# Patient Record
Sex: Male | Born: 1985 | Race: Black or African American | Hispanic: No | Marital: Single | Smoking: Former smoker
Health system: Southern US, Community
[De-identification: ages and names within clinical notes are randomized; demographics above are authoritative.]

## PROBLEM LIST (undated history)

## (undated) DIAGNOSIS — K819 Cholecystitis, unspecified: Secondary | ICD-10-CM

## (undated) DIAGNOSIS — F1910 Other psychoactive substance abuse, uncomplicated: Secondary | ICD-10-CM

## (undated) HISTORY — DX: Other psychoactive substance abuse, uncomplicated: F19.10

## (undated) HISTORY — DX: Cholecystitis, unspecified: K81.9

---

## 2004-09-05 ENCOUNTER — Emergency Department: Payer: Self-pay | Admitting: General Practice

## 2005-01-05 ENCOUNTER — Emergency Department: Payer: Self-pay | Admitting: Emergency Medicine

## 2005-01-06 ENCOUNTER — Emergency Department: Payer: Self-pay | Admitting: Emergency Medicine

## 2005-10-25 ENCOUNTER — Emergency Department: Payer: Self-pay | Admitting: Emergency Medicine

## 2007-03-29 ENCOUNTER — Emergency Department: Payer: Self-pay | Admitting: Emergency Medicine

## 2009-07-16 ENCOUNTER — Emergency Department: Payer: Self-pay | Admitting: Emergency Medicine

## 2009-11-09 ENCOUNTER — Emergency Department: Payer: Self-pay | Admitting: Emergency Medicine

## 2010-05-30 ENCOUNTER — Emergency Department: Payer: Self-pay | Admitting: Emergency Medicine

## 2010-06-26 ENCOUNTER — Emergency Department: Payer: Self-pay | Admitting: Emergency Medicine

## 2010-06-27 ENCOUNTER — Inpatient Hospital Stay: Payer: Self-pay | Admitting: Psychiatry

## 2010-09-08 ENCOUNTER — Emergency Department: Payer: Self-pay | Admitting: Emergency Medicine

## 2010-10-02 ENCOUNTER — Emergency Department: Payer: Self-pay | Admitting: Emergency Medicine

## 2011-01-25 ENCOUNTER — Emergency Department: Payer: Self-pay | Admitting: Emergency Medicine

## 2012-02-04 ENCOUNTER — Emergency Department: Payer: Self-pay | Admitting: Internal Medicine

## 2012-02-04 LAB — URINALYSIS, COMPLETE
Bacteria: NONE SEEN
Blood: NEGATIVE
Glucose,UR: NEGATIVE mg/dL (ref 0–75)
Ketone: NEGATIVE
Leukocyte Esterase: NEGATIVE
Nitrite: NEGATIVE
RBC,UR: <1 /HPF (ref 0–5)
Squamous Epithelial: NONE SEEN
WBC UR: <1 /HPF (ref 0–5)

## 2012-02-04 LAB — DRUG SCREEN, URINE
Amphetamines, Ur Screen: NEGATIVE (ref ?–1000)
Benzodiazepine, Ur Scrn: NEGATIVE (ref ?–200)
Cannabinoid 50 Ng, Ur ~~LOC~~: POSITIVE (ref ?–50)
Cocaine Metabolite,Ur ~~LOC~~: NEGATIVE (ref ?–300)
MDMA (Ecstasy)Ur Screen: NEGATIVE (ref ?–500)
Opiate, Ur Screen: NEGATIVE (ref ?–300)
Tricyclic, Ur Screen: NEGATIVE (ref ?–1000)

## 2012-02-04 LAB — COMPREHENSIVE METABOLIC PANEL
Anion Gap: 5 — ABNORMAL LOW (ref 7–16)
Calcium, Total: 9.3 mg/dL (ref 8.5–10.1)
Chloride: 107 mmol/L (ref 98–107)
Co2: 29 mmol/L (ref 21–32)
EGFR (Non-African Amer.): >60
Osmolality: 280 (ref 275–301)
Potassium: 3.5 mmol/L (ref 3.5–5.1)
Sodium: 141 mmol/L (ref 136–145)
Total Protein: 8 g/dL (ref 6.4–8.2)

## 2012-02-04 LAB — CBC
HCT: 46.4 % (ref 40.0–52.0)
HGB: 16 g/dL (ref 13.0–18.0)
MCH: 28.8 pg (ref 26.0–34.0)
MCV: 84 fL (ref 80–100)
RBC: 5.55 10*6/uL (ref 4.40–5.90)
WBC: 7 10*3/uL (ref 3.8–10.6)

## 2012-08-08 ENCOUNTER — Emergency Department: Payer: Self-pay | Admitting: Emergency Medicine

## 2012-08-08 LAB — COMPREHENSIVE METABOLIC PANEL
Albumin: 3.8 g/dL (ref 3.4–5.0)
Alkaline Phosphatase: 94 U/L (ref 50–136)
Anion Gap: 3 — ABNORMAL LOW (ref 7–16)
BUN: 11 mg/dL (ref 7–18)
Calcium, Total: 9.2 mg/dL (ref 8.5–10.1)
Chloride: 107 mmol/L (ref 98–107)
Co2: 28 mmol/L (ref 21–32)
Creatinine: 1.02 mg/dL (ref 0.60–1.30)
EGFR (Non-African Amer.): >60
Glucose: 97 mg/dL (ref 65–99)
Sodium: 138 mmol/L (ref 136–145)

## 2012-08-08 LAB — RAPID HIV-1/2 QL/CONFIRM: HIV-1/2,Rapid Ql: NEGATIVE

## 2012-08-08 LAB — CBC
HCT: 42.2 % (ref 40.0–52.0)
MCH: 28.2 pg (ref 26.0–34.0)
MCV: 83 fL (ref 80–100)
RBC: 5.06 10*6/uL (ref 4.40–5.90)

## 2012-09-27 ENCOUNTER — Emergency Department: Payer: Self-pay | Admitting: Emergency Medicine

## 2012-09-27 LAB — DRUG SCREEN, URINE
Amphetamines, Ur Screen: NEGATIVE (ref ?–1000)
Benzodiazepine, Ur Scrn: NEGATIVE (ref ?–200)
Cannabinoid 50 Ng, Ur ~~LOC~~: POSITIVE (ref ?–50)
MDMA (Ecstasy)Ur Screen: NEGATIVE (ref ?–500)
Methadone, Ur Screen: NEGATIVE (ref ?–300)
Opiate, Ur Screen: NEGATIVE (ref ?–300)
Tricyclic, Ur Screen: NEGATIVE (ref ?–1000)

## 2012-09-27 LAB — URINALYSIS, COMPLETE
Bacteria: NONE SEEN
Bilirubin,UR: NEGATIVE
Glucose,UR: NEGATIVE mg/dL (ref 0–75)
Nitrite: NEGATIVE
Ph: 6 (ref 4.5–8.0)
Protein: NEGATIVE
RBC,UR: 1 /HPF (ref 0–5)
Specific Gravity: 1.03 (ref 1.003–1.030)

## 2012-09-27 LAB — COMPREHENSIVE METABOLIC PANEL
Albumin: 3.9 g/dL (ref 3.4–5.0)
Alkaline Phosphatase: 78 U/L (ref 50–136)
Bilirubin,Total: 0.3 mg/dL (ref 0.2–1.0)
Chloride: 108 mmol/L — ABNORMAL HIGH (ref 98–107)
Creatinine: 1.27 mg/dL (ref 0.60–1.30)
EGFR (African American): >60
Glucose: 104 mg/dL — ABNORMAL HIGH (ref 65–99)
Osmolality: 281 (ref 275–301)
Potassium: 4 mmol/L (ref 3.5–5.1)
SGOT(AST): 28 U/L (ref 15–37)
SGPT (ALT): 27 U/L (ref 12–78)

## 2012-09-27 LAB — PROTIME-INR
INR: 1
Prothrombin Time: 13.8 secs (ref 11.5–14.7)

## 2012-09-27 LAB — APTT: Activated PTT: 27.6 secs (ref 23.6–35.9)

## 2012-09-27 LAB — CBC
HGB: 14.4 g/dL (ref 13.0–18.0)
MCV: 82 fL (ref 80–100)
Platelet: 201 10*3/uL (ref 150–440)
RBC: 5.16 10*6/uL (ref 4.40–5.90)
RDW: 12.9 % (ref 11.5–14.5)
WBC: 7.4 10*3/uL (ref 3.8–10.6)

## 2012-09-27 LAB — ETHANOL: Ethanol: <3 mg/dL

## 2012-09-27 LAB — PHENYTOIN LEVEL, TOTAL: Dilantin: <0.4 ug/mL — ABNORMAL LOW (ref 10.0–20.0)

## 2012-09-29 IMAGING — CT CT HEAD WITHOUT CONTRAST
2 series · 16 of 30 positions shown, 20 images · non-contrast
Comparison: none

REASON FOR EXAM: seizure
COMMENTS:

[Series 2: without · axial · non-contrast · 0.41mm/px · z∈[+506,+621]mm · 13 of 27 slices shown, 17 images]
[im 2/27  brain]
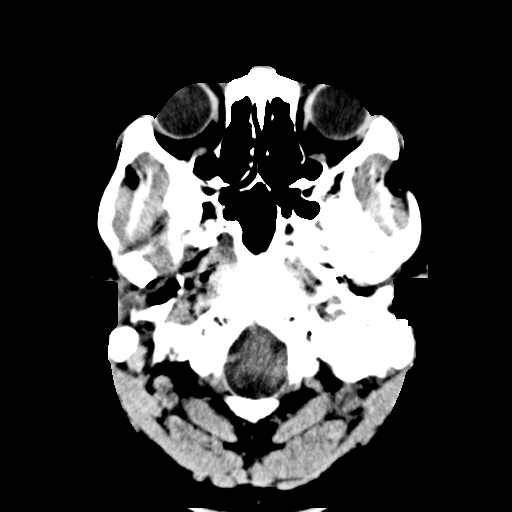
[im 2/27  bone]
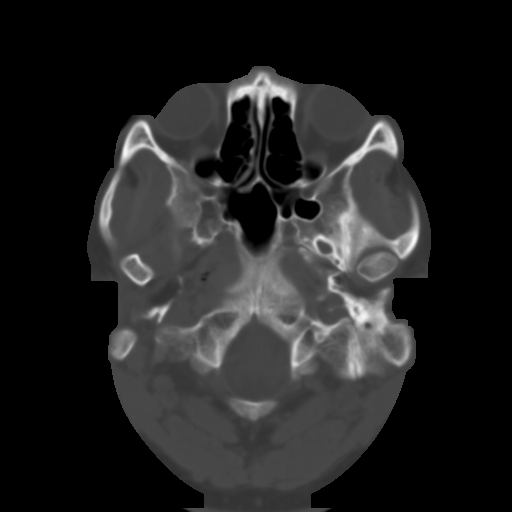
[im 4/27  brain]
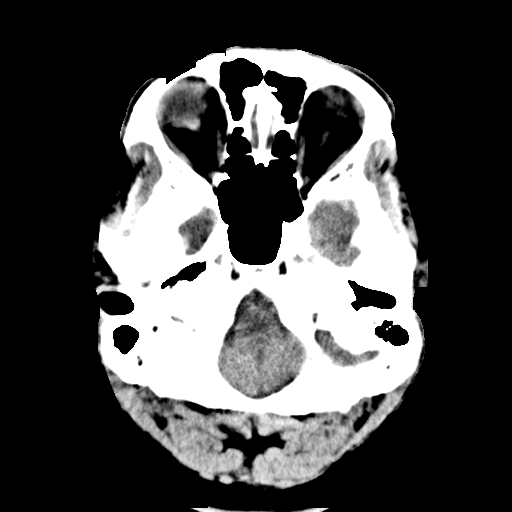
[im 6/27  brain]
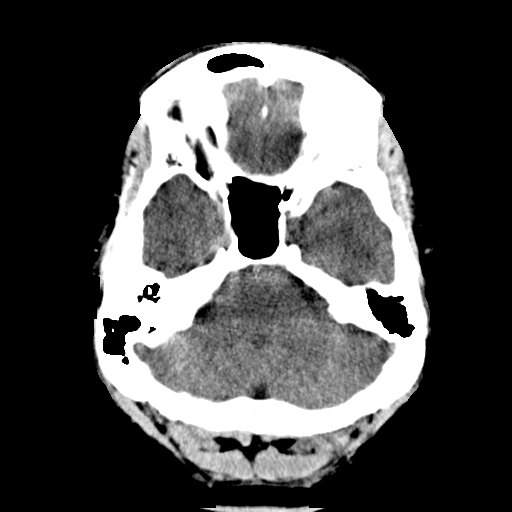
[im 8/27  brain]
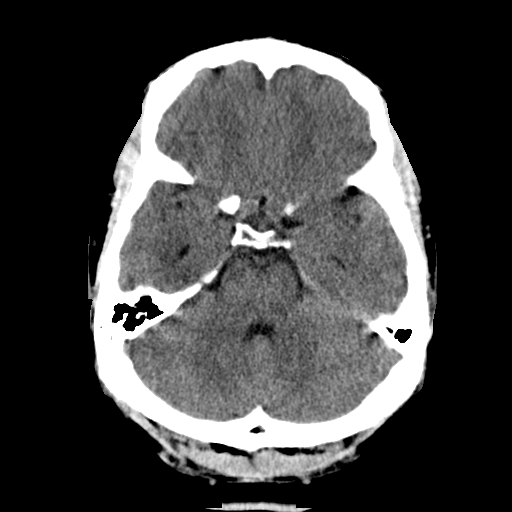
[im 10/27  brain]
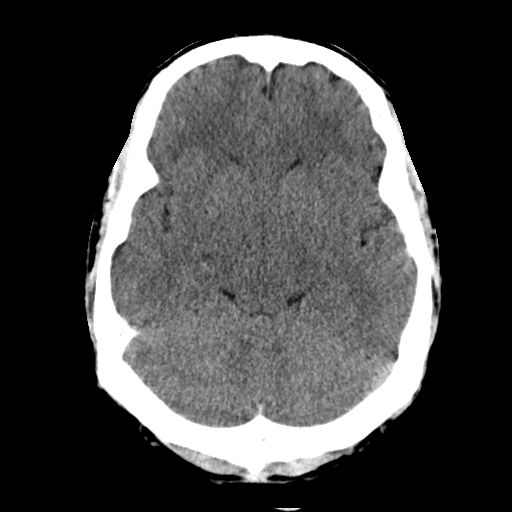
[im 10/27  bone]
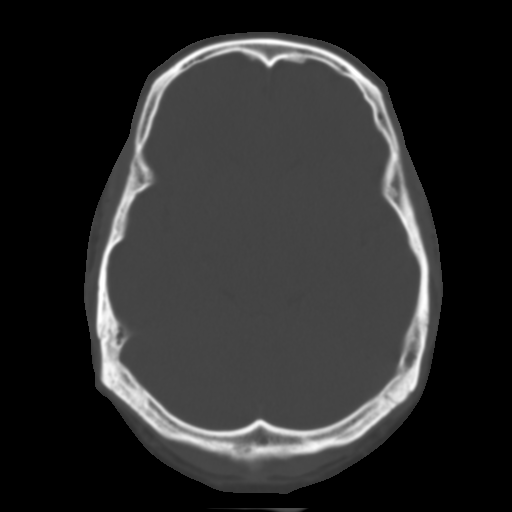
[im 12/27  brain]
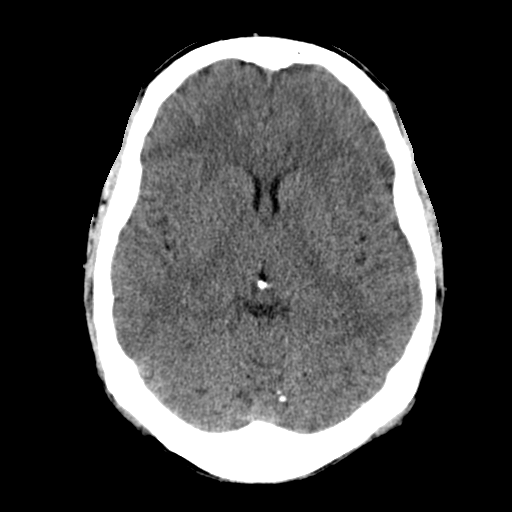
[im 14/27  brain]
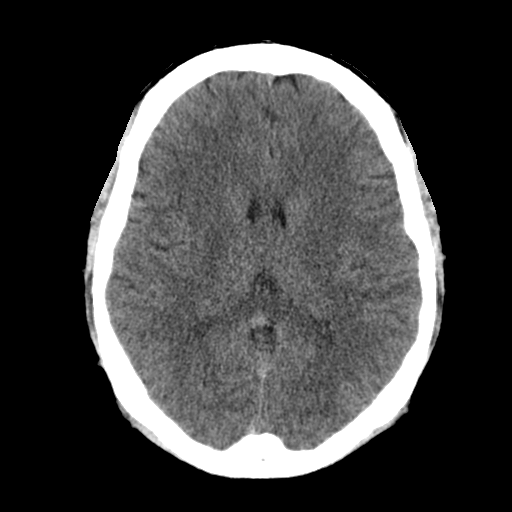
[im 15/27  brain]
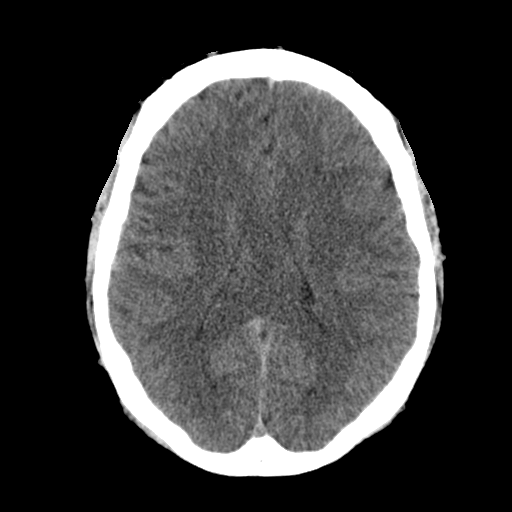
[im 17/27  brain]
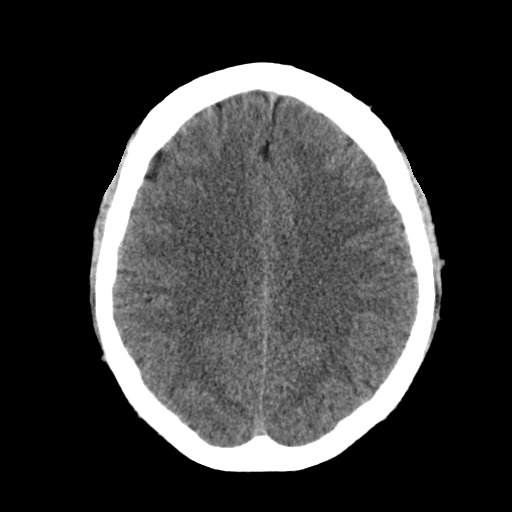
[im 17/27  bone]
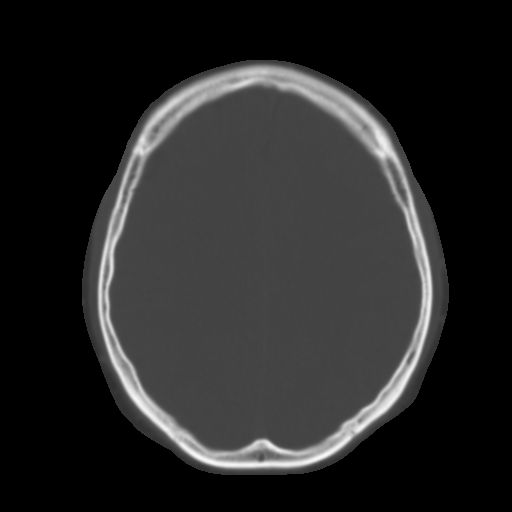
[im 19/27  brain]
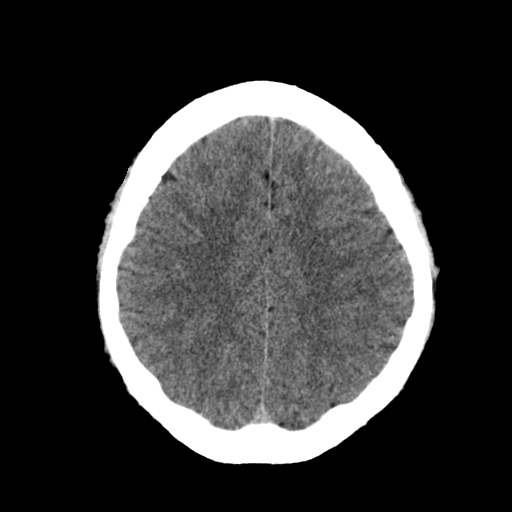
[im 21/27  brain]
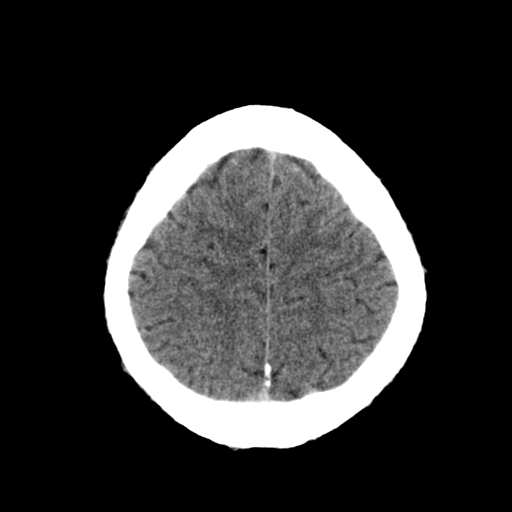
[im 23/27  brain]
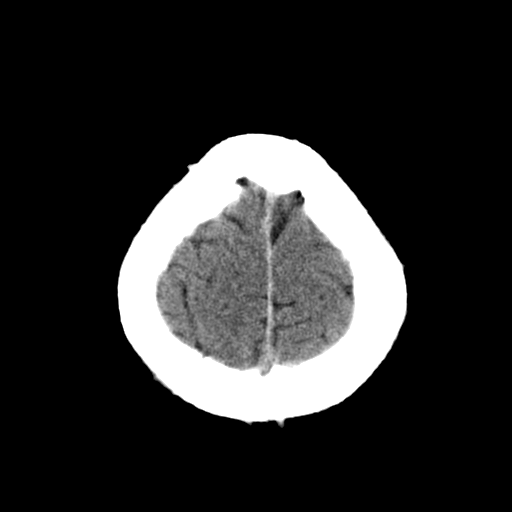
[im 25/27  brain]
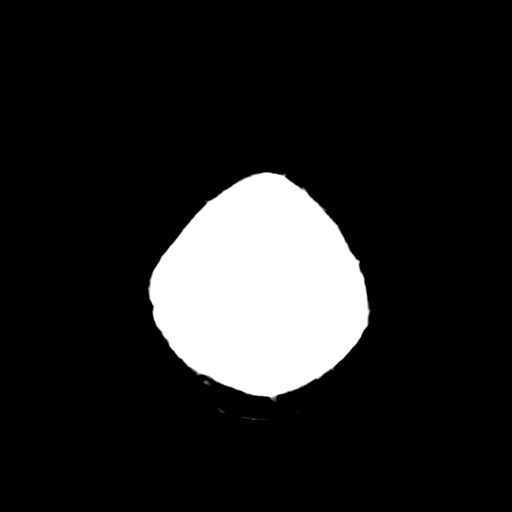
[im 25/27  bone]
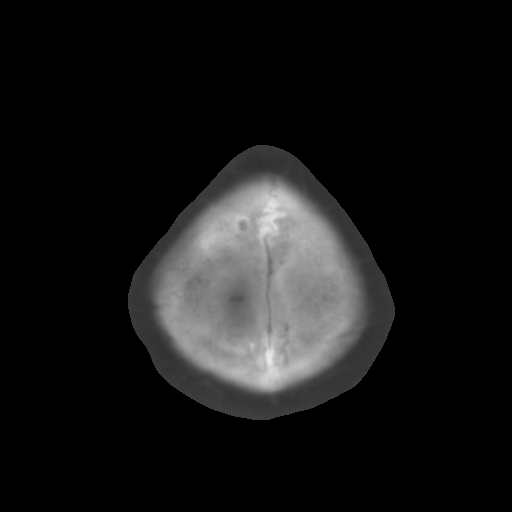

[Series 3: bone · axial · 0.41mm/px · z∈[+506,+546]mm · 3 of 27 slices shown]
[im 2/27  bone]
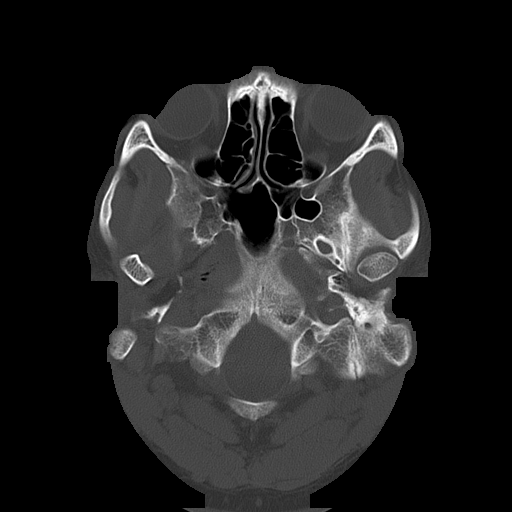
[im 6/27  bone]
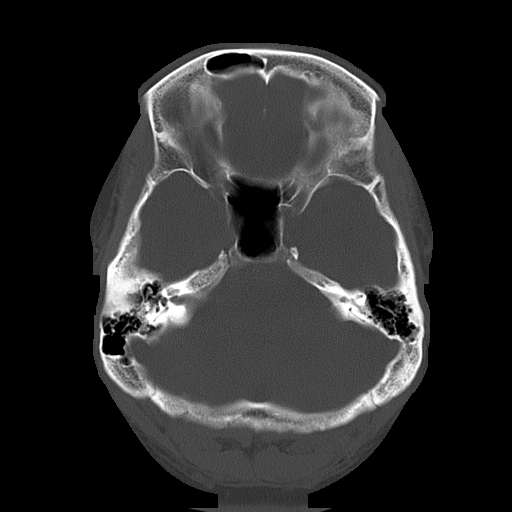
[im 10/27  bone]
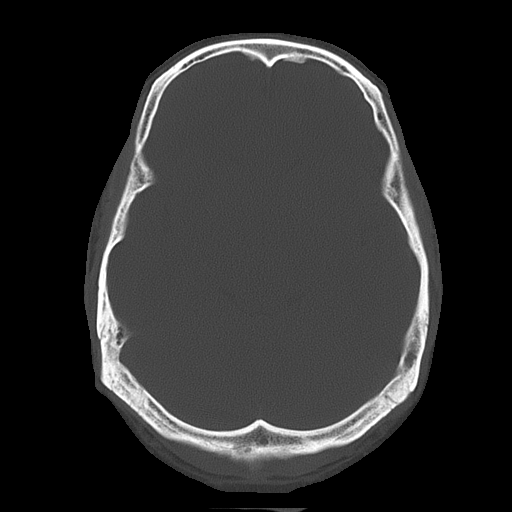

[16 of 30 positions shown; findings below may reference images not displayed]

PROCEDURE:     CT  - CT HEAD WITHOUT CONTRAST  - May 30, 2010  [DATE]

RESULT:     Axial noncontrast CT scanning was performed through the brain at
5 mm intervals and slice thicknesses.

The ventricles are normal in size and position. There is no intracranial
hemorrhage nor intracranial mass effect. There is no evidence of an evolving
ischemic infarction. The cerebellum and brainstem are normal in density. At
bone window settings the observed portions of the paranasal sinuses and
mastoid air cells are clear. There is no evidence of an acute skull fracture.
IMPRESSION: 1. I see no acute intracranial abnormality.
2. There is no evidence of an acute skull fracture.

## 2013-10-14 DIAGNOSIS — F329 Major depressive disorder, single episode, unspecified: Secondary | ICD-10-CM | POA: Insufficient documentation

## 2013-10-14 DIAGNOSIS — F32A Depression, unspecified: Secondary | ICD-10-CM | POA: Insufficient documentation

## 2015-02-10 ENCOUNTER — Emergency Department
Admission: EM | Admit: 2015-02-10 | Discharge: 2015-02-10 | Disposition: A | Discharge status: Home / Self Care | Payer: 59 | Attending: Emergency Medicine | Admitting: Emergency Medicine

## 2015-02-10 ENCOUNTER — Encounter: Payer: Self-pay | Admitting: *Deleted

## 2015-02-10 DIAGNOSIS — F17200 Nicotine dependence, unspecified, uncomplicated: Secondary | ICD-10-CM | POA: Diagnosis not present

## 2015-02-10 DIAGNOSIS — J020 Streptococcal pharyngitis: Secondary | ICD-10-CM | POA: Insufficient documentation

## 2015-02-10 DIAGNOSIS — Z880 Allergy status to penicillin: Secondary | ICD-10-CM | POA: Insufficient documentation

## 2015-02-10 DIAGNOSIS — J029 Acute pharyngitis, unspecified: Secondary | ICD-10-CM | POA: Diagnosis present

## 2015-02-10 DIAGNOSIS — M549 Dorsalgia, unspecified: Secondary | ICD-10-CM | POA: Diagnosis not present

## 2015-02-10 DIAGNOSIS — H9203 Otalgia, bilateral: Secondary | ICD-10-CM | POA: Insufficient documentation

## 2015-02-10 DIAGNOSIS — M542 Cervicalgia: Secondary | ICD-10-CM | POA: Diagnosis not present

## 2015-02-10 MED ORDER — IBUPROFEN 800 MG PO TABS: 800.0000 mg | ORAL_TABLET | Freq: Three times a day (TID) | ORAL | Status: DC | PRN

## 2015-02-10 MED ORDER — CYCLOBENZAPRINE HCL 10 MG PO TABS: 10.0000 mg | ORAL_TABLET | Freq: Three times a day (TID) | ORAL | Status: DC | PRN

## 2015-02-10 MED ORDER — AZITHROMYCIN 250 MG PO TABS: ORAL_TABLET | ORAL | Status: DC

## 2015-02-10 MED ORDER — CLINDAMYCIN HCL 300 MG PO CAPS: 300.0000 mg | ORAL_CAPSULE | Freq: Three times a day (TID) | ORAL | Status: DC

## 2015-02-10 NOTE — ED Notes (Signed)
Has sharp pain behind both ears. Also side of left neck pain, hurt to move, back of throat tonsils slightly enlarged

## 2015-02-10 NOTE — ED Provider Notes (Signed)
[facility] Emergency Department Provider Note  ____________________________________________  Time seen: Approximately 12:58 PM  I have reviewed the triage vital signs and the nursing notes.   HISTORY  Chief Complaint Sore Throat    HPI Jeffery Sutton is a 29 y.o. male who presents for evaluation bilateral ear pain with pain behind both ears also on the left side of his neck. Patient complains of his tonsils are slightly enlarged.   History reviewed. No pertinent past medical history.  There are no active problems to display for this patient.   History reviewed. No pertinent past surgical history.  Current Outpatient Rx  Name  Route  Sig  Dispense  Refill  . clindamycin (CLEOCIN) 300 MG capsule   Oral   Take 1 capsule (300 mg total) by mouth 3 (three) times daily.   30 capsule   0   . cyclobenzaprine (FLEXERIL) 10 MG tablet   Oral   Take 1 tablet (10 mg total) by mouth every 8 (eight) hours as needed for muscle spasms.   30 tablet   1   . ibuprofen (ADVIL,MOTRIN) 800 MG tablet   Oral   Take 1 tablet (800 mg total) by mouth every 8 (eight) hours as needed.   30 tablet   0     Allergies Zithromax and Penicillins  No family history on file.  Social History Social History  Substance Use Topics  . Smoking status: Current Every Day Smoker  . Smokeless tobacco: None  . Alcohol Use: Yes    Review of Systems Constitutional: No fever/chills Eyes: No visual changes. ENT: Positive sore throat and positive bilateral ear pain. Cardiovascular: Denies chest pain. Respiratory: Denies shortness of breath. Gastrointestinal: No abdominal pain.  No nausea, no vomiting.  No diarrhea.  No constipation. Genitourinary: Negative for dysuria. Musculoskeletal: Positive for neck and back pain. Skin: Negative for rash. Neurological: Negative for headaches, focal weakness or numbness.  10-point ROS otherwise  negative.  ____________________________________________   PHYSICAL EXAM:  VITAL SIGNS: ED Triage Vitals  Enc Vitals Group     BP 02/10/15 1246 134/89 mmHg     Pulse Rate 02/10/15 1246 78     Resp 02/10/15 1246 20     Temp 02/10/15 1246 98.4 F (36.9 C)     Temp Source 02/10/15 1246 Oral     SpO2 02/10/15 1246 98 %     Weight 02/10/15 1246 190 lb (86.183 kg)     Height 02/10/15 1246 6' (1.829 m)     Head Cir --      Peak Flow --      Pain Score 02/10/15 1248 4     Pain Loc --      Pain Edu? --      Excl. in GC? --     Constitutional: Alert and oriented. Well appearing and in no acute distress. Eyes: Conjunctivae are normal. PERRL. EOMI. Head: Atraumatic. Nose: No congestion/rhinnorhea. Mouth/Throat: Mucous membranes are moist.  Oropharynx non-erythematous. Neck: No stridor.   Cardiovascular: Normal rate, regular rhythm. Grossly normal heart sounds.  Good peripheral circulation. Respiratory: Normal respiratory effort.  No retractions. Lungs CTAB. Gastrointestinal: Soft and nontender. No distention. No abdominal bruits. No CVA tenderness. Musculoskeletal: No lower extremity tenderness nor edema.  No joint effusions. Neurologic:  Normal speech and language. No gross focal neurologic deficits are appreciated. No gait instability. Skin:  Skin is warm, dry and intact. No rash noted. Psychiatric: Mood and affect are normal. Speech and behavior are normal.  ____________________________________________   LABS (all labs ordered are listed, but only abnormal results are displayed)  Labs Reviewed - No data to display ____________________________________________    PROCEDURES  Procedure(s) performed: None  Critical Care performed: No  ____________________________________________   INITIAL IMPRESSION / ASSESSMENT AND PLAN / ED COURSE  Pertinent labs & imaging results that were available during my care of the patient were reviewed by me and considered in my medical  decision making (see chart for details).  Acute strep pharyngitis. Rx given for clindamycin 300 mg 3 times a day 10 days secondary to Zithromax and penicillin allergy. In addition Rx was given for Motrin 800 and Flexeril 10 mg. Patient to follow up with PCP for referral to ENT. Work excuse given times one day. Patient to return with any worsening symptomology. ____________________________________________   FINAL CLINICAL IMPRESSION(S) / ED DIAGNOSES  Final diagnoses:  Streptococcal pharyngitis      Evangeline DakinCharles M Beers, PA-C 02/10/15 1342  Richardean Canalavid H Yao, MD 02/10/15 [phone]

## 2015-02-15 LAB — POCT RAPID STREP A: Streptococcus, Group A Screen (Direct): POSITIVE — AB

## 2015-10-30 ENCOUNTER — Observation Stay: Payer: Commercial Managed Care - HMO | Admitting: Anesthesiology

## 2015-10-30 ENCOUNTER — Emergency Department: Payer: Commercial Managed Care - HMO

## 2015-10-30 ENCOUNTER — Encounter
Admission: EM | Disposition: A | Discharge status: Home / Self Care | Payer: Self-pay | Source: Home / Self Care | Attending: Emergency Medicine

## 2015-10-30 ENCOUNTER — Encounter: Payer: Self-pay | Admitting: Emergency Medicine

## 2015-10-30 ENCOUNTER — Ambulatory Visit: Admit: 2015-10-30 | Payer: Self-pay | Admitting: Surgery

## 2015-10-30 ENCOUNTER — Observation Stay
Admission: EM | Admit: 2015-10-30 | Discharge: 2015-11-01 | Disposition: A | Discharge status: Home / Self Care | Payer: Commercial Managed Care - HMO | Attending: Surgery | Admitting: Surgery

## 2015-10-30 DIAGNOSIS — L0291 Cutaneous abscess, unspecified: Secondary | ICD-10-CM

## 2015-10-30 DIAGNOSIS — Z880 Allergy status to penicillin: Secondary | ICD-10-CM | POA: Insufficient documentation

## 2015-10-30 DIAGNOSIS — L02414 Cutaneous abscess of left upper limb: Secondary | ICD-10-CM | POA: Diagnosis not present

## 2015-10-30 DIAGNOSIS — F17210 Nicotine dependence, cigarettes, uncomplicated: Secondary | ICD-10-CM | POA: Insufficient documentation

## 2015-10-30 HISTORY — PX: I & D EXTREMITY: SHX5045

## 2015-10-30 LAB — BASIC METABOLIC PANEL
Anion gap: 9 (ref 5–15)
BUN: 9 mg/dL (ref 6–20)
CALCIUM: 9.3 mg/dL (ref 8.9–10.3)
CO2: 22 mmol/L (ref 22–32)
CREATININE: 1.1 mg/dL (ref 0.61–1.24)
Chloride: 107 mmol/L (ref 101–111)
GFR calc non Af Amer: >60 mL/min (ref >60)
Glucose, Bld: 94 mg/dL (ref 65–99)
Potassium: 3.7 mmol/L (ref 3.5–5.1)
SODIUM: 138 mmol/L (ref 135–145)

## 2015-10-30 LAB — CBC WITH DIFFERENTIAL/PLATELET
BASOS PCT: 0 %
Basophils Absolute: 0 10*3/uL (ref 0–0.1)
EOS ABS: 0.1 10*3/uL (ref 0–0.7)
EOS PCT: 0 %
HCT: 45.9 % (ref 40.0–52.0)
Hemoglobin: 15.7 g/dL (ref 13.0–18.0)
Lymphocytes Relative: 7 %
Lymphs Abs: 1.2 10*3/uL (ref 1.0–3.6)
MCH: 28.3 pg (ref 26.0–34.0)
MCHC: 34.3 g/dL (ref 32.0–36.0)
MCV: 82.5 fL (ref 80.0–100.0)
MONO ABS: 1.3 10*3/uL — AB (ref 0.2–1.0)
MONOS PCT: 8 %
Neutro Abs: 13.5 10*3/uL — ABNORMAL HIGH (ref 1.4–6.5)
Neutrophils Relative %: 85 %
Platelets: 244 10*3/uL (ref 150–440)
RBC: 5.56 MIL/uL (ref 4.40–5.90)
RDW: 14.1 % (ref 11.5–14.5)
WBC: 16.1 10*3/uL — ABNORMAL HIGH (ref 3.8–10.6)

## 2015-10-30 SURGERY — IRRIGATION AND DEBRIDEMENT EXTREMITY
Anesthesia: General | Site: Arm Upper | Laterality: Left | Wound class: Dirty or Infected

## 2015-10-30 MED ORDER — BUPIVACAINE HCL (PF) 0.5 % IJ SOLN
INTRAMUSCULAR | Status: AC
  Filled 2015-10-30: qty 30

## 2015-10-30 MED ORDER — ACETAMINOPHEN 650 MG RE SUPP: 650.0000 mg | Freq: Four times a day (QID) | RECTAL | Status: DC | PRN

## 2015-10-30 MED ORDER — MORPHINE SULFATE (PF) 4 MG/ML IV SOLN
4.0000 mg | Freq: Once | INTRAVENOUS | Status: AC
  Administered 2015-10-30: 4 mg via INTRAVENOUS
  Filled 2015-10-30: qty 1

## 2015-10-30 MED ORDER — ACETAMINOPHEN 10 MG/ML IV SOLN
INTRAVENOUS | Status: AC
  Filled 2015-10-30: qty 100

## 2015-10-30 MED ORDER — ONDANSETRON HCL 4 MG/2ML IJ SOLN: 4.0000 mg | Freq: Once | INTRAMUSCULAR | Status: DC | PRN

## 2015-10-30 MED ORDER — MORPHINE SULFATE (PF) 4 MG/ML IV SOLN
4.0000 mg | INTRAVENOUS | Status: DC | PRN
  Administered 2015-10-31: 4 mg via INTRAVENOUS
  Filled 2015-10-30: qty 1

## 2015-10-30 MED ORDER — VANCOMYCIN HCL IN DEXTROSE 1-5 GM/200ML-% IV SOLN
1000.0000 mg | Freq: Once | INTRAVENOUS | Status: AC
  Administered 2015-10-30: 1000 mg via INTRAVENOUS
  Filled 2015-10-30: qty 200

## 2015-10-30 MED ORDER — ONDANSETRON 4 MG PO TBDP: 4.0000 mg | ORAL_TABLET | Freq: Four times a day (QID) | ORAL | Status: DC | PRN

## 2015-10-30 MED ORDER — KCL IN DEXTROSE-NACL 20-5-0.45 MEQ/L-%-% IV SOLN
INTRAVENOUS | Status: DC
  Administered 2015-10-30: 20:00:00 via INTRAVENOUS
  Filled 2015-10-30 (×4): qty 1000

## 2015-10-30 MED ORDER — FENTANYL CITRATE (PF) 100 MCG/2ML IJ SOLN
INTRAMUSCULAR | Status: DC | PRN
  Administered 2015-10-30 (×2): 50 ug via INTRAVENOUS

## 2015-10-30 MED ORDER — CEFAZOLIN SODIUM-DEXTROSE 2-4 GM/100ML-% IV SOLN
2.0000 g | Freq: Three times a day (TID) | INTRAVENOUS | Status: DC
  Administered 2015-10-30 – 2015-10-31 (×3): 2 g via INTRAVENOUS
  Filled 2015-10-30 (×6): qty 100

## 2015-10-30 MED ORDER — HYDROCODONE-ACETAMINOPHEN 5-325 MG PO TABS: 1.0000 | ORAL_TABLET | ORAL | Status: DC | PRN

## 2015-10-30 MED ORDER — PROPOFOL 10 MG/ML IV BOLUS
INTRAVENOUS | Status: DC | PRN
  Administered 2015-10-30: 140 mg via INTRAVENOUS

## 2015-10-30 MED ORDER — ONDANSETRON HCL 4 MG/2ML IJ SOLN: 4.0000 mg | Freq: Four times a day (QID) | INTRAMUSCULAR | Status: DC | PRN

## 2015-10-30 MED ORDER — ACETAMINOPHEN 10 MG/ML IV SOLN
INTRAVENOUS | Status: DC | PRN
  Administered 2015-10-30: 1000 mg via INTRAVENOUS

## 2015-10-30 MED ORDER — LIDOCAINE HCL (PF) 1 % IJ SOLN
INTRAMUSCULAR | Status: AC
  Filled 2015-10-30: qty 30

## 2015-10-30 MED ORDER — MIDAZOLAM HCL 2 MG/2ML IJ SOLN
INTRAMUSCULAR | Status: DC | PRN
  Administered 2015-10-30: 2 mg via INTRAVENOUS

## 2015-10-30 MED ORDER — HYDROCODONE-ACETAMINOPHEN 5-325 MG PO TABS
1.0000 | ORAL_TABLET | ORAL | Status: DC | PRN
  Administered 2015-10-31: 1 via ORAL
  Administered 2015-10-31 (×3): 2 via ORAL
  Administered 2015-10-31: 1 via ORAL
  Administered 2015-11-01: 2 via ORAL
  Filled 2015-10-30 (×2): qty 2
  Filled 2015-10-30: qty 1
  Filled 2015-10-30 (×2): qty 2
  Filled 2015-10-30: qty 1

## 2015-10-30 MED ORDER — SODIUM CHLORIDE 0.9 % IV BOLUS (SEPSIS)
1000.0000 mL | Freq: Once | INTRAVENOUS | Status: AC
  Administered 2015-10-30: 1000 mL via INTRAVENOUS

## 2015-10-30 MED ORDER — LACTATED RINGERS IV SOLN
INTRAVENOUS | Status: DC | PRN
  Administered 2015-10-30: 18:00:00 via INTRAVENOUS

## 2015-10-30 MED ORDER — DEXAMETHASONE SODIUM PHOSPHATE 10 MG/ML IJ SOLN
INTRAMUSCULAR | Status: DC | PRN
  Administered 2015-10-30: 10 mg via INTRAVENOUS

## 2015-10-30 MED ORDER — MORPHINE SULFATE (PF) 4 MG/ML IV SOLN: 4.0000 mg | INTRAVENOUS | Status: DC | PRN

## 2015-10-30 MED ORDER — ONDANSETRON HCL 4 MG/2ML IJ SOLN
INTRAMUSCULAR | Status: DC | PRN
  Administered 2015-10-30: 4 mg via INTRAVENOUS

## 2015-10-30 MED ORDER — IOPAMIDOL (ISOVUE-300) INJECTION 61%
100.0000 mL | Freq: Once | INTRAVENOUS | Status: AC | PRN
  Administered 2015-10-30: 100 mL via INTRAVENOUS

## 2015-10-30 MED ORDER — ACETAMINOPHEN 325 MG PO TABS
650.0000 mg | ORAL_TABLET | Freq: Four times a day (QID) | ORAL | Status: DC | PRN
  Administered 2015-10-31: 650 mg via ORAL
  Filled 2015-10-30: qty 2

## 2015-10-30 MED ORDER — ACETAMINOPHEN 325 MG PO TABS: 650.0000 mg | ORAL_TABLET | Freq: Four times a day (QID) | ORAL | Status: DC | PRN

## 2015-10-30 MED ORDER — ONDANSETRON HCL 4 MG/2ML IJ SOLN
4.0000 mg | Freq: Once | INTRAMUSCULAR | Status: AC
  Administered 2015-10-30: 4 mg via INTRAVENOUS
  Filled 2015-10-30: qty 2

## 2015-10-30 MED ORDER — KCL IN DEXTROSE-NACL 20-5-0.45 MEQ/L-%-% IV SOLN
INTRAVENOUS | Status: DC
  Filled 2015-10-30 (×2): qty 1000

## 2015-10-30 MED ORDER — FENTANYL CITRATE (PF) 100 MCG/2ML IJ SOLN
25.0000 ug | INTRAMUSCULAR | Status: DC | PRN
  Administered 2015-10-30 (×2): 25 ug via INTRAVENOUS

## 2015-10-30 MED ORDER — GLYCOPYRROLATE 0.2 MG/ML IJ SOLN
INTRAMUSCULAR | Status: DC | PRN
  Administered 2015-10-30: 0.2 mg via INTRAVENOUS

## 2015-10-30 MED ORDER — FENTANYL CITRATE (PF) 100 MCG/2ML IJ SOLN
INTRAMUSCULAR | Status: AC
  Administered 2015-10-30: 25 ug via INTRAVENOUS
  Filled 2015-10-30: qty 2

## 2015-10-30 MED ORDER — CEFAZOLIN SODIUM-DEXTROSE 2-4 GM/100ML-% IV SOLN
2.0000 g | INTRAVENOUS | Status: DC
  Filled 2015-10-30: qty 100

## 2015-10-30 SURGICAL SUPPLY — 25 items
BLADE SURG 15 STRL LF DISP TIS (BLADE) ×2 IMPLANT
BLADE SURG 15 STRL SS (BLADE) ×1
CHLORAPREP W/TINT 26ML (MISCELLANEOUS) ×3 IMPLANT
DRAPE LAPAROTOMY 100X77 ABD (DRAPES) ×3 IMPLANT
ELECT CAUTERY NEEDLE TIP 1.0 (MISCELLANEOUS) ×3
ELECT REM PT RETURN 9FT ADLT (ELECTROSURGICAL) ×3
ELECTRODE CAUTERY NEDL TIP 1.0 (MISCELLANEOUS) ×2 IMPLANT
ELECTRODE REM PT RTRN 9FT ADLT (ELECTROSURGICAL) ×2 IMPLANT
GAUZE SPONGE 4X4 12PLY STRL (GAUZE/BANDAGES/DRESSINGS) ×3 IMPLANT
GLOVE BIO SURGEON STRL SZ7.5 (GLOVE) ×6 IMPLANT
GLOVE INDICATOR 8.0 STRL GRN (GLOVE) ×6 IMPLANT
GOWN STRL REUS W/ TWL LRG LVL3 (GOWN DISPOSABLE) ×2 IMPLANT
GOWN STRL REUS W/TWL LRG LVL3 (GOWN DISPOSABLE) ×1
KIT RM TURNOVER STRD PROC AR (KITS) ×3 IMPLANT
LABEL OR SOLS (LABEL) ×3 IMPLANT
NEEDLE HYPO 25X1 1.5 SAFETY (NEEDLE) ×3 IMPLANT
NS IRRIG 500ML POUR BTL (IV SOLUTION) ×3 IMPLANT
PACK BASIN MINOR ARMC (MISCELLANEOUS) ×3 IMPLANT
SUT ETHILON 4-0 (SUTURE) ×1
SUT ETHILON 4-0 FS2 18XMFL BLK (SUTURE) ×2
SUT SILK 3 0 REEL (SUTURE) ×3 IMPLANT
SUTURE ETHLN 4-0 FS2 18XMF BLK (SUTURE) ×2 IMPLANT
SWAB DUAL CULTURE TRANS RED ST (MISCELLANEOUS) IMPLANT
SYR BULB EAR ULCER 3OZ GRN STR (SYRINGE) ×3 IMPLANT
SYRINGE 10CC LL (SYRINGE) ×3 IMPLANT

## 2015-10-30 NOTE — ED Notes (Signed)
Patient transported to CT 

## 2015-10-30 NOTE — Anesthesia Preprocedure Evaluation (Signed)
Anesthesia Evaluation  Patient identified by MRN, date of birth, ID band Patient awake    Reviewed: Allergy & Precautions, NPO status , Patient's Chart, lab work & pertinent test results, reviewed documented beta blocker date and time   Airway Mallampati: III  TM Distance: >3 FB     Dental  (+) Chipped   Pulmonary Current Smoker,           Cardiovascular      Neuro/Psych    GI/Hepatic   Endo/Other    Renal/GU      Musculoskeletal   Abdominal   Peds  Hematology   Anesthesia Other Findings   Reproductive/Obstetrics                             Anesthesia Physical Anesthesia Plan  ASA: II  Anesthesia Plan: General   Post-op Pain Management:    Induction: Intravenous  Airway Management Planned: LMA  Additional Equipment:   Intra-op Plan:   Post-operative Plan:   Informed Consent: I have reviewed the patients History and Physical, chart, labs and discussed the procedure including the risks, benefits and alternatives for the proposed anesthesia with the patient or authorized representative who has indicated his/her understanding and acceptance.     Plan Discussed with: CRNA  Anesthesia Plan Comments:         Anesthesia Quick Evaluation

## 2015-10-30 NOTE — Transfer of Care (Signed)
Immediate Anesthesia Transfer of Care Note  Patient: Jeffery SilversJermaine O Sutton  Procedure(s) Performed: Procedure(s): MINOR IRRIGATION AND DEBRIDEMENT EXTREMITY (Left)  Patient Location: PACU  Anesthesia Type:General  Level of Consciousness: sedated and responds to stimulation  Airway & Oxygen Therapy: Patient Spontanous Breathing and Patient connected to nasal cannula oxygen  Post-op Assessment: Report given to RN and Post -op Vital signs reviewed and stable  Post vital signs: Reviewed and stable  Last Vitals:  Vitals:   10/30/15 1730 10/30/15 1847  BP: (!) 143/89 (!) 105/51  Pulse: 84   Resp: 18 15  Temp:  36.3 C    Last Pain:  Vitals:   10/30/15 1847  TempSrc: Tympanic  PainSc:          Complications: No apparent anesthesia complications

## 2015-10-30 NOTE — H&P (Signed)
Jeffery Sutton is a 30 y.o. male  with five-day history of left arm swelling and tenderness.  HPI: He noted some discomfort above his antecubital fossa for 5 days ago which is increased in severity over the last 24 hours. He's had some spontaneous drainage. He's never had a previous similar problem. He denies any IV drug use. He does not have any history of significant infection in the past. White blood cell count was significantly elevated and CT scan demonstrated subcutaneous abscess. The surgical service was consulted.  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 Otherwise he has no major medical problems. He denies any allergies although the chart suggests he has penicillin allergy. He also denies any illicit drug use other than marijuana. We specifically discussed cocaine which he denied.  History reviewed. No pertinent past medical history. History reviewed. No pertinent surgical history. Social History   Social History  . Marital status: Single    Spouse name: N/A  . Number of children: N/A  . Years of education: N/A   Social History Main Topics  . Smoking status: Current Every Day Smoker    Packs/day: 0.50    Types: Cigarettes  . Smokeless tobacco: Never Used  . Alcohol use Yes     Comment: weekends  . Drug use: Unknown  . Sexual activity: Not Asked   Other Topics Concern  . None   Social History Narrative  . None     Review of Systems  All other systems reviewed and are negative.    PHYSICAL EXAM: BP 139/88 (BP Location: Right Arm)   Pulse 80   Temp 98.9 F (37.2 C) (Oral)   Resp 18   Ht 6' (1.829 m)   Wt 86.2 kg (190 lb)   SpO2 100%   BMI 25.77 kg/m   Physical Exam  Constitutional: He is oriented to person, place, and time. He appears well-developed and well-nourished. He appears distressed.  HENT:  Head: Normocephalic and atraumatic.  Eyes: EOM are normal. Pupils are equal, round, and reactive to light.  Neck: Normal range of  motion. Neck supple.  Cardiovascular: Normal rate, regular rhythm and normal heart sounds.   Pulmonary/Chest: Effort normal. No respiratory distress. He has no wheezes.  Abdominal: Soft. Bowel sounds are normal.  Musculoskeletal: Normal range of motion. He exhibits edema. He exhibits no deformity.  Marked edema left upper arm with large abscess just above the antecubital fossa.  Neurological: He is alert and oriented to person, place, and time.  Skin:  Abscess antecubital fossa.  Psychiatric: He has a normal mood and affect. Judgment normal.   See above  Impression/Plan: I have independently reviewed his laboratory values and CT scan. He has significant problems with any sort of manipulation of his arm. He clearly needs some increased drainage as he does not have a very good spontaneous drainage at the present time. We'll taken the operating room open this up under monitored anesthetic care. Risks and benefits have been outlined to the patient in detail and he is in agreement.   Tiney Rougealph Ely III, MD  10/30/2015, 4:58 PM

## 2015-10-30 NOTE — ED Notes (Signed)
Pt dressed in hospital gown. Lip ring removed and given to pt's friend at bedside. Pt states he ate last night, had a soda around 1030 this morning, and a sip of soda around 1630.

## 2015-10-30 NOTE — Anesthesia Procedure Notes (Signed)
Procedure Name: LMA Insertion Date/Time: 10/30/2015 6:16 PM Performed by: Omer JackWEATHERLY, Abas Leicht Pre-anesthesia Checklist: Patient identified, Patient being monitored, Timeout performed, Emergency Drugs available and Suction available Patient Re-evaluated:Patient Re-evaluated prior to inductionOxygen Delivery Method: Circle system utilized Preoxygenation: Pre-oxygenation with 100% oxygen Intubation Type: IV induction Ventilation: Mask ventilation without difficulty LMA: LMA inserted LMA Size: 4.5 Tube type: Oral Number of attempts: 1 Placement Confirmation: positive ETCO2 and breath sounds checked- equal and bilateral Tube secured with: Tape Dental Injury: Teeth and Oropharynx as per pre-operative assessment

## 2015-10-30 NOTE — ED Triage Notes (Signed)
States walked into a spider web on Sunday and notice a bug bite on his left antecube on weds. Red, painful, swollen.

## 2015-10-30 NOTE — ED Provider Notes (Signed)
[facility] Emergency Department Provider Note  Time seen: 1:54 PM  I have reviewed the triage vital signs and the nursing notes.   HISTORY  Chief Complaint Abscess    HPI Charlies SilversJermaine O Golliday is a 30 y.o. male with no past medical history who presents the emergency department with a left arm infection. According to the patient approximately 5 days ago he noted a small bump to his left antecubital fossa. He states over the past 2 days it is becoming extremely painful extremely large and now draining. Patient states subjective fever and chills at home. Denies nausea or vomiting. Describes his pain as moderate dull aching pain. Denies IV drug use.  History reviewed. No pertinent past medical history.  There are no active problems to display for this patient.   History reviewed. No pertinent surgical history.  Prior to Admission medications   Medication Sig Start Date End Date Taking? Authorizing Provider  clindamycin (CLEOCIN) 300 MG capsule Take 1 capsule (300 mg total) by mouth 3 (three) times daily. 02/10/15   Charmayne Sheerharles M Beers, PA-C  cyclobenzaprine (FLEXERIL) 10 MG tablet Take 1 tablet (10 mg total) by mouth every 8 (eight) hours as needed for muscle spasms. 02/10/15   Charmayne Sheerharles M Beers, PA-C  ibuprofen (ADVIL,MOTRIN) 800 MG tablet Take 1 tablet (800 mg total) by mouth every 8 (eight) hours as needed. 02/10/15   Evangeline Dakinharles M Beers, PA-C    Allergies  Allergen Reactions  . Zithromax [Azithromycin] Swelling  . Penicillins Hives    History reviewed. No pertinent family history.  Social History Social History  Substance Use Topics  . Smoking status: Current Every Day Smoker    Packs/day: 0.50    Types: Cigarettes  . Smokeless tobacco: Never Used  . Alcohol use Yes     Comment: weekends    Review of Systems Constitutional: Subjective fever and chills. Cardiovascular: Negative for chest pain. Respiratory: Negative for shortness of breath. Gastrointestinal:  Negative for abdominal pain, vomiting  Musculoskeletal: Negative for back pain. Skin: Large rash/swelling to left antecubital fossa. Neurological: Negative for headache 10-point ROS otherwise negative.  ____________________________________________   PHYSICAL EXAM:  VITAL SIGNS: ED Triage Vitals  Enc Vitals Group     BP 10/30/15 1135 (!) 147/76     Pulse Rate 10/30/15 1135 96     Resp --      Temp 10/30/15 1135 98.9 F (37.2 C)     Temp Source 10/30/15 1135 Oral     SpO2 10/30/15 1135 100 %     Weight 10/30/15 1135 190 lb (86.2 kg)     Height 10/30/15 1135 6' (1.829 m)     Head Circumference --      Peak Flow --      Pain Score 10/30/15 1150 10     Pain Loc --      Pain Edu? --      Excl. in GC? --     Constitutional: Alert and oriented. Well appearing and in no distress. Eyes: Normal exam ENT   Head: Normocephalic and atraumatic.   Mouth/Throat: Mucous membranes are moist. Cardiovascular: Normal rate, regular rhythm. No murmur Respiratory: Normal respiratory effort without tachypnea nor retractions. Breath sounds are clear Gastrointestinal: Soft and nontender. No distention.  Musculoskeletal: Patient has large swollen tender erythematous area to left antecubital fossa, appears consistent with abscess with central draining and large amount of surrounding induration and erythema. Neurologic:  Normal speech and language. No gross focal neurologic deficits are appreciated. Skin:  Skin is warm, dry and intact.  Psychiatric: Mood and affect are normal. Speech and behavior are normal.   ____________________________________________     RADIOLOGY  CT consistent with abscess with surrounding cellulitis.  ____________________________________________   INITIAL IMPRESSION / ASSESSMENT AND PLAN / ED COURSE  Pertinent labs & imaging results that were available during my care of the patient were reviewed by me and considered in my medical decision making (see chart for  details).  The patient presents for a large left antecubital fossa abscess/cellulitis. Patient's labs show an elevated white blood cell count. We'll proceed with a CT of the arm to further evaluate. We will check blood cultures, begin IV vancomycin as well as pain and nausea medication. The patient denies any history of IV drug use.  CT consistent with abscess and cellulitis. Patient receiving IV antibiotics. Discussed the case with Dr. Michela PitcherEly, who will be taken to the operating room for incision and drainage. ____________________________________________   FINAL CLINICAL IMPRESSION(S) / ED DIAGNOSES  Abscess, left upper extremity Cellulitis    Minna AntisKevin Greenley Martone, MD 10/30/15 2031

## 2015-10-30 NOTE — Op Note (Signed)
10/30/2015  6:42 PM  PATIENT:  Glean SalvoJermaine O Lagasse  30 y.o. male  PRE-OPERATIVE DIAGNOSIS:  left arm abcess  POST-OPERATIVE DIAGNOSIS:  * No post-op diagnosis entered *  PROCEDURE:  Procedure(s): MINOR IRRIGATION AND DEBRIDEMENT EXTREMITY (Left)  SURGEON:  Surgeon(s) and Role:    * Tiney Rougealph Ely III, MD - Primary   ASSISTANTS: none   ANESTHESIA:   general  EBL:  No intake/output data recorded.   DRAINS: Penrose drain in the Wound   LOCAL MEDICATIONS USED:  NONE   DISPOSITION OF SPECIMEN:  N/A   DICTATION: .Dragon Dictation with the patient supine position and after induction appropriate general anesthesia the patient's left arm prepped with Betadine and draped sterile towels. A timeout was observed. Chronic area was opened and approximately 5 cc of frank pus was removed. There was a fairly large area of necrotic tissue which was debrided. Penrose drains were placed through counter incisions wet to dry dressing placed in the bed of the wound sterile dressing applied. Patient returned recovery room having tolerated procedure well. Sponge instrument needle count were correct 2 in the operating room.  PLAN OF CARE: Admit for overnight observation  PATIENT DISPOSITION:  PACU - hemodynamically stable.   Tiney Rougealph Ely III, MD

## 2015-10-31 MED ORDER — SULFAMETHOXAZOLE-TRIMETHOPRIM 400-80 MG PO TABS
1.0000 | ORAL_TABLET | Freq: Two times a day (BID) | ORAL | Status: DC
  Administered 2015-10-31 – 2015-11-01 (×2): 1 via ORAL
  Filled 2015-10-31 (×2): qty 1

## 2015-10-31 MED ORDER — ALUM & MAG HYDROXIDE-SIMETH 200-200-20 MG/5ML PO SUSP
30.0000 mL | Freq: Four times a day (QID) | ORAL | Status: DC | PRN
  Administered 2015-10-31: 30 mL via ORAL
  Filled 2015-10-31: qty 30

## 2015-10-31 NOTE — Care Management (Signed)
Informed by care team that patient is for discharge today and there are no discharge needs.

## 2015-10-31 NOTE — Anesthesia Postprocedure Evaluation (Signed)
Anesthesia Post Note  Patient: Jeffery SalvoJermaine O Sutton  Procedure(s) Performed: Procedure(s) (LRB): MINOR IRRIGATION AND DEBRIDEMENT EXTREMITY (Left)  Patient location during evaluation: PACU Anesthesia Type: General Level of consciousness: awake and alert Pain management: pain level controlled Vital Signs Assessment: post-procedure vital signs reviewed and stable Respiratory status: spontaneous breathing, nonlabored ventilation, respiratory function stable and patient connected to nasal cannula oxygen Cardiovascular status: blood pressure returned to baseline and stable Postop Assessment: no signs of nausea or vomiting Anesthetic complications: no    Last Vitals:  Vitals:   10/31/15 0550 10/31/15 1311  BP: (!) 118/50 (!) 145/81  Pulse: 75 84  Resp: 20 17  Temp: 36.8 C 36.8 C    Last Pain:  Vitals:   10/31/15 1817  TempSrc:   PainSc: 0-No pain                 Terel Bann S

## 2015-10-31 NOTE — Progress Notes (Signed)
POD # 1 s/p a/d/ antecubital abscess on left Doing well  Significant pain  PE NAD Wound healing well, penrose in place, no necrotizing infection  A/P doing well heplock ivf Transition to PO A/Bs Still requiring wound care and parenteral A/Bs DC in am

## 2015-10-31 NOTE — Progress Notes (Signed)
Pt informed Dr Pabon at bedside that he would like to be discharged this afternoon and then changed his mind and would like to be discharged tomorrow.  Left message for Dr Pabon while in surgery.  

## 2015-11-01 MED ORDER — SULFAMETHOXAZOLE-TRIMETHOPRIM 400-80 MG PO TABS: 1.0000 | ORAL_TABLET | Freq: Two times a day (BID) | ORAL | 0 refills | Status: DC

## 2015-11-01 NOTE — Progress Notes (Signed)
Alert and oriented. Vital signs stable . No signs of acute distress. Discharge instructions given. Patient verbalizes understanding. No other issues noted at this time.   

## 2015-11-01 NOTE — Discharge Summary (Signed)
  Patient ID: Jeffery SilversJermaine O Sutton MRN: [id number] DOB/AGE: 1986/02/13 30 y.o.  Admit date: 10/30/2015 Discharge date: 11/01/2015   Discharge Diagnoses:  Active Problems:   Abscess of arm, left   Procedures:I/D left antecubital abscess  Hospital Course: 30 year old male without any major comorbidities presented with an abscess of the left antecubital fossa. He was adamant that he had any IV drug use. He does report that he did donated plasma a week ago or so. He was seen and examined by Dr. Michela PitcherEly and was found to have an abscess and was taken to the operating room for an I&D with Penrose drain placement. He did well needed antibiotic postoperatively and wound care. At the time of discharge she was ambulating, he was tolerating regular diet and he was afebrile. His physical exam reveals a male in no acute distress awake alert his left arm had significant improvement with a Penrose in place and with an open wound. No evidence of necrotizing infection or undrained collections. His abdomen was soft and nontender. Condition of the patient at time of discharge is stable   Disposition: 01-Home or Self Care  Discharge Instructions    Call MD for:  difficulty breathing, headache or visual disturbances    Complete by:  As directed   Call MD for:  extreme fatigue    Complete by:  As directed   Call MD for:  hives    Complete by:  As directed   Call MD for:  persistant dizziness or light-headedness    Complete by:  As directed   Call MD for:  persistant nausea and vomiting    Complete by:  As directed   Call MD for:  redness, tenderness, or signs of infection (pain, swelling, redness, odor or green/yellow discharge around incision site)    Complete by:  As directed   Call MD for:  severe uncontrolled pain    Complete by:  As directed   Call MD for:  temperature >100.4    Complete by:  As directed   Diet - low sodium heart healthy    Complete by:  As directed   Discharge instructions    Complete by:  As  directed   Daily dry dressing to left arm, shower daily   Discharge wound care:    Complete by:  As directed   Daily dry dressing, keep penrose drain   Increase activity slowly    Complete by:  As directed       Medication List    TAKE these medications   ibuprofen 800 MG tablet Commonly known as:  ADVIL,MOTRIN Take 1 tablet (800 mg total) by mouth every 8 (eight) hours as needed.   sulfamethoxazole-trimethoprim 400-80 MG tablet Commonly known as:  BACTRIM,SEPTRA Take 1 tablet by mouth every 12 (twelve) hours.      Follow-up Information    Leafy Roiego F Pabon, MD Follow up in 2 week(s).   Specialty:  General Surgery Contact information: [address] [place] [id number] [phone]            Sterling Bigiego Pabon, MD FACS

## 2015-11-03 ENCOUNTER — Encounter: Payer: Self-pay | Admitting: Surgery

## 2015-11-04 LAB — CULTURE, BLOOD (ROUTINE X 2)
CULTURE: NO GROWTH
Culture: NO GROWTH

## 2015-11-11 ENCOUNTER — Ambulatory Visit (INDEPENDENT_AMBULATORY_CARE_PROVIDER_SITE_OTHER): Payer: 59 | Admitting: Surgery

## 2015-11-11 ENCOUNTER — Encounter: Payer: Self-pay | Admitting: Surgery

## 2015-11-11 VITALS — BP 123/78 | HR 80 | Temp 98.2°F | Ht 72.0 in | Wt 183.8 lb

## 2015-11-11 DIAGNOSIS — Z09 Encounter for follow-up examination after completed treatment for conditions other than malignant neoplasm: Secondary | ICD-10-CM

## 2015-11-11 NOTE — Progress Notes (Signed)
S/p I/d left antecubital abscess  Penrose removed today Doing well No fevers or chills No growth from cultures  PE : NAD  Wound healing well, good granulation tissue, no tracks  A/P doing well Daily band aid RTC prn

## 2015-11-11 NOTE — Patient Instructions (Addendum)
Continue to take your antibiotics until they are gone.   Please call with any questions or concerns.

## 2015-11-16 ENCOUNTER — Telehealth: Payer: Self-pay | Admitting: Surgery

## 2015-11-16 ENCOUNTER — Telehealth: Payer: Self-pay

## 2015-11-16 NOTE — Telephone Encounter (Signed)
Patient's FMLA Form was filled out and faxed to [phone] (Attention Hewitt Bladeracy W. Walker).

## 2015-11-16 NOTE — Telephone Encounter (Signed)
Called patient to ask when did he or when will he return to work. Patient stated that he would return to work today. I told him that his FMLA form was being filled out today and will fax as soon as its done.

## 2019-04-23 ENCOUNTER — Other Ambulatory Visit: Payer: Self-pay

## 2019-04-23 ENCOUNTER — Emergency Department
Admission: EM | Admit: 2019-04-23 | Discharge: 2019-04-23 | Disposition: A | Discharge status: Home / Self Care | Payer: Self-pay | Attending: Student in an Organized Health Care Education/Training Program | Admitting: Student in an Organized Health Care Education/Training Program

## 2019-04-23 ENCOUNTER — Encounter: Payer: Self-pay | Admitting: Emergency Medicine

## 2019-04-23 ENCOUNTER — Emergency Department: Payer: Self-pay

## 2019-04-23 DIAGNOSIS — F17210 Nicotine dependence, cigarettes, uncomplicated: Secondary | ICD-10-CM | POA: Insufficient documentation

## 2019-04-23 DIAGNOSIS — Z113 Encounter for screening for infections with a predominantly sexual mode of transmission: Secondary | ICD-10-CM | POA: Insufficient documentation

## 2019-04-23 DIAGNOSIS — N451 Epididymitis: Secondary | ICD-10-CM | POA: Insufficient documentation

## 2019-04-23 DIAGNOSIS — N50811 Right testicular pain: Secondary | ICD-10-CM

## 2019-04-23 LAB — URINALYSIS, COMPLETE (UACMP) WITH MICROSCOPIC
Bacteria, UA: NONE SEEN
Bilirubin Urine: NEGATIVE
Glucose, UA: NEGATIVE mg/dL
Hgb urine dipstick: NEGATIVE
Ketones, ur: NEGATIVE mg/dL
Leukocytes,Ua: NEGATIVE
Nitrite: NEGATIVE
Protein, ur: NEGATIVE mg/dL
Specific Gravity, Urine: 1.02 (ref 1.005–1.030)
Squamous Epithelial / LPF: NONE SEEN (ref 0–5)
pH: 6 (ref 5.0–8.0)

## 2019-04-23 LAB — HIV ANTIBODY (ROUTINE TESTING W REFLEX): HIV Screen 4th Generation wRfx: NONREACTIVE

## 2019-04-23 LAB — CHLAMYDIA/NGC RT PCR (ARMC ONLY): N gonorrhoeae: NOT DETECTED

## 2019-04-23 LAB — CHLAMYDIA/NGC RT PCR (ARMC ONLY)??????????: Chlamydia Tr: DETECTED — AB

## 2019-04-23 MED ORDER — KETOROLAC TROMETHAMINE 30 MG/ML IJ SOLN
30.0000 mg | Freq: Once | INTRAMUSCULAR | Status: AC
  Administered 2019-04-23: 30 mg via INTRAMUSCULAR
  Filled 2019-04-23: qty 1

## 2019-04-23 MED ORDER — LEVOFLOXACIN 500 MG PO TABS: 500.0000 mg | ORAL_TABLET | Freq: Every day | ORAL | 0 refills | Status: AC

## 2019-04-23 MED ORDER — LEVOFLOXACIN 500 MG PO TABS
500.0000 mg | ORAL_TABLET | Freq: Once | ORAL | Status: AC
  Administered 2019-04-23: 500 mg via ORAL
  Filled 2019-04-23: qty 1

## 2019-04-23 MED ORDER — HYDROCODONE-ACETAMINOPHEN 5-325 MG PO TABS: 1.0000 | ORAL_TABLET | ORAL | 0 refills | Status: DC | PRN

## 2019-04-23 MED ORDER — CEFTRIAXONE SODIUM 250 MG IJ SOLR
250.0000 mg | Freq: Once | INTRAMUSCULAR | Status: AC
Start: 2019-04-23 — End: 2019-04-23
  Administered 2019-04-23: 250 mg via INTRAMUSCULAR
  Filled 2019-04-23: qty 250

## 2019-04-23 NOTE — ED Triage Notes (Signed)
Patient presents to the ED with right testicular pain that started after he had reached up for something out of a high cabinet and heard a "pop" to his right lower abdomen.  Patient states testicle has gotten increasingly painful and sore and now is very painful to the touch.  Patient walking with a slight limp.

## 2019-04-23 NOTE — ED Notes (Signed)
Pt discharged by another nurse - taking him out of the system.

## 2019-04-23 NOTE — ED Provider Notes (Signed)
[facility] Emergency Department Provider Note    First MD Initiated Contact with Patient 04/23/19 1354     (approximate)  I have reviewed the triage vital signs and the nursing notes.   HISTORY  Chief Complaint Testicle Pain    HPI Jeffery Sutton is a 34 y.o. male the below listed past medical history presents to the ER for evaluation of right testicular pain over the past 3 days.  States he did notice feeling a small pop in his right groin delegated been progressively worsening over the past 2 3 days.  Denies any measured fevers.  No nausea or vomiting.  No flank pain.  Is never had pain like this before.  He is sexually active with both males and females.  Has not had any recent sexual intercourse he states.  Denies any dysuria.    Past Medical History:  Diagnosis Date  . Cholecystitis   . Substance abuse (HCC)    Marijuana - Last Use 11/10/15 - Verified 11/11/15   Family History  Problem Relation Age of Onset  . Healthy Mother   . Healthy Father   . Diabetes Maternal Grandmother   . Hypertension Paternal Grandmother    Past Surgical History:  Procedure Laterality Date  . I & D EXTREMITY Left 10/30/2015   Procedure: IRRIGATION AND DEBRIDEMENT EXTREMITY;  Surgeon: Tiney Rouge III, MD;  Location: [facility] ORS;  Service: General;  Laterality: Left;   Patient Active Problem List   Diagnosis Date Noted  . Abscess of arm, left 10/30/2015  . Abscess   . Depression 10/14/2013      Prior to Admission medications   Medication Sig Start Date End Date Taking? Authorizing Provider  HYDROcodone-acetaminophen (NORCO) 5-325 MG tablet Take 1 tablet by mouth every 4 (four) hours as needed for moderate pain. 04/23/19   Willy Eddy, MD  ibuprofen (ADVIL,MOTRIN) 800 MG tablet Take 1 tablet (800 mg total) by mouth every 8 (eight) hours as needed. 02/10/15   Beers, Charmayne Sheer, PA-C  levofloxacin (LEVAQUIN) 500 MG tablet Take 1 tablet (500 mg total) by mouth daily  for 9 days. 04/23/19 05/02/19  Willy Eddy, MD  sulfamethoxazole-trimethoprim (BACTRIM,SEPTRA) 400-80 MG tablet Take 1 tablet by mouth every 12 (twelve) hours. 11/01/15   Leafy Ro, MD    Allergies Penicillins and Zithromax [azithromycin]    Social History Social History   Tobacco Use  . Smoking status: Current Every Day Smoker    Packs/day: 0.50    Types: Cigarettes  . Smokeless tobacco: Never Used  Substance Use Topics  . Alcohol use: Yes    Comment: 2-4 Beers/ Weekly  . Drug use: Yes    Types: Marijuana    Comment: Last Use- 11/10/15 (Verified 11/11/15)    Review of Systems Patient denies headaches, rhinorrhea, blurry vision, numbness, shortness of breath, chest pain, edema, cough, abdominal pain, nausea, vomiting, diarrhea, dysuria, fevers, rashes or hallucinations unless otherwise stated above in HPI. ____________________________________________   PHYSICAL EXAM:  VITAL SIGNS: Vitals:   04/23/19 1205  BP: 134/88  Pulse: 72  Resp: 16  Temp: 98.3 F (36.8 C)  SpO2: 100%    Constitutional: Alert and oriented.  Eyes: Conjunctivae are normal.  Head: Atraumatic. Nose: No congestion/rhinnorhea. Mouth/Throat: Mucous membranes are moist.   Neck: No stridor. Painless ROM.  Cardiovascular: Normal rate, regular rhythm. Grossly normal heart sounds.  Good peripheral circulation. Respiratory: Normal respiratory effort.  No retractions. Lungs CTAB. Gastrointestinal: Soft and nontender. No distention. No abdominal  bruits. No CVA tenderness. Genitourinary: Tenderness to palpation of the right posterior testicle consistent with epididymitis.  Positive Prehn sign.  Cremasteric reflexes present.  Does have some right inguinal lymphadenopathy.  No fluctuance.  No overlying cellulitic changes or crepitus.  No hernia appreciated. Musculoskeletal: No lower extremity tenderness nor edema.  No joint effusions. Neurologic:  Normal speech and language. No gross focal neurologic  deficits are appreciated. No facial droop Skin:  Skin is warm, dry and intact. No rash noted. Psychiatric: Mood and affect are normal. Speech and behavior are normal.  ____________________________________________   LABS (all labs ordered are listed, but only abnormal results are displayed)  No results found for this or any previous visit (from the past 24 hour(s)). ____________________________________________ ____________________________________________  RADIOLOGY  I personally reviewed all radiographic images ordered to evaluate for the above acute complaints and reviewed radiology reports and findings.  These findings were personally discussed with the patient.  Please see medical record for radiology report.  ____________________________________________   PROCEDURES  Procedure(s) performed:  Procedures    Critical Care performed: no ____________________________________________   INITIAL IMPRESSION / ASSESSMENT AND PLAN / ED COURSE  Pertinent labs & imaging results that were available during my care of the patient were reviewed by me and considered in my medical decision making (see chart for details).   DDX: Epididymitis, orchitis, cystitis, torsion, abscess, mass hernia  Jeffery Sutton is a 34 y.o. who presents to the ED with symptoms as described above ultrasound ordered for above differential which does show evidence of acute epididymitis.  Not consistent with torsion by exam.  Abdominal exam is soft and benign.  No evidence of hernia.  Will give pain medication.  Will cover with antibiotics.  We discussed supportive management and signs and symptoms for which he should return to the ER.  Patient consented to be tested for STI including HIV.  Have discussed with the patient and available family all diagnostics and treatments performed thus far and all questions were answered to the best of my ability. The patient demonstrates understanding and agreement with plan.        The patient was evaluated in Emergency Department today for the symptoms described in the history of present illness. He/she was evaluated in the context of the global COVID-19 pandemic, which necessitated consideration that the patient might be at risk for infection with the SARS-CoV-2 virus that causes COVID-19. Institutional protocols and algorithms that pertain to the evaluation of patients at risk for COVID-19 are in a state of rapid change based on information released by regulatory bodies including the CDC and federal and state organizations. These policies and algorithms were followed during the patient's care in the ED.  As part of my medical decision making, I reviewed the following data within the electronic MEDICAL RECORD NUMBER Nursing notes reviewed and incorporated, Labs reviewed, notes from prior ED visits and [place] Controlled Substance Database   ____________________________________________   FINAL CLINICAL IMPRESSION(S) / ED DIAGNOSES  Final diagnoses:  Epididymitis, right      NEW MEDICATIONS STARTED DURING THIS VISIT:  New Prescriptions   HYDROCODONE-ACETAMINOPHEN (NORCO) 5-325 MG TABLET    Take 1 tablet by mouth every 4 (four) hours as needed for moderate pain.   LEVOFLOXACIN (LEVAQUIN) 500 MG TABLET    Take 1 tablet (500 mg total) by mouth daily for 9 days.     Note:  This document was prepared using Dragon voice recognition software and may include unintentional dictation errors.  Merlyn Lot, MD 04/23/19 [phone]

## 2019-04-24 LAB — URINE CULTURE: Culture: <10000 — AB

## 2019-04-27 ENCOUNTER — Telehealth: Payer: Self-pay | Admitting: Emergency Medicine

## 2019-04-27 NOTE — Telephone Encounter (Signed)
Called patient to inform of std results.  Positive for chlamydia.  He is taking the levaquin.  Informed of need for partner treatment and availability of free treatment at [facility].

## 2020-06-10 ENCOUNTER — Ambulatory Visit: Payer: Self-pay

## 2020-06-11 ENCOUNTER — Other Ambulatory Visit: Payer: Self-pay

## 2020-06-11 ENCOUNTER — Encounter: Payer: Self-pay | Admitting: Emergency Medicine

## 2020-06-11 ENCOUNTER — Emergency Department
Admission: EM | Admit: 2020-06-11 | Discharge: 2020-06-11 | Disposition: A | Discharge status: Home / Self Care | Payer: Self-pay | Attending: Emergency Medicine | Admitting: Emergency Medicine

## 2020-06-11 DIAGNOSIS — L03317 Cellulitis of buttock: Secondary | ICD-10-CM

## 2020-06-11 DIAGNOSIS — F17210 Nicotine dependence, cigarettes, uncomplicated: Secondary | ICD-10-CM | POA: Insufficient documentation

## 2020-06-11 DIAGNOSIS — L0231 Cutaneous abscess of buttock: Secondary | ICD-10-CM | POA: Insufficient documentation

## 2020-06-11 MED ORDER — LIDOCAINE-EPINEPHRINE 2 %-1:100000 IJ SOLN: 20.0000 mL | Freq: Once | INTRAMUSCULAR | Status: DC

## 2020-06-11 MED ORDER — OXYCODONE-ACETAMINOPHEN 7.5-325 MG PO TABS: 1.0000 | ORAL_TABLET | Freq: Four times a day (QID) | ORAL | 0 refills | Status: AC | PRN

## 2020-06-11 MED ORDER — SULFAMETHOXAZOLE-TRIMETHOPRIM 800-160 MG PO TABS: 1.0000 | ORAL_TABLET | Freq: Two times a day (BID) | ORAL | 0 refills | Status: DC

## 2020-06-11 MED ORDER — NAPROXEN 500 MG PO TABS: 500.0000 mg | ORAL_TABLET | Freq: Two times a day (BID) | ORAL | Status: DC

## 2020-06-11 MED ORDER — LIDOCAINE-EPINEPHRINE 2 %-1:100000 IJ SOLN
30.0000 mL | Freq: Once | INTRAMUSCULAR | Status: AC
  Administered 2020-06-11: 30 mL
  Filled 2020-06-11: qty 2

## 2020-06-11 NOTE — Discharge Instructions (Addendum)
Advised sitz bath twice a day for 10 to 15 minutes.  Follow discharge care instruction.  Take medication as directed.  Return back in 2 days for wound check.

## 2020-06-11 NOTE — ED Notes (Signed)
Pt with "hair bump"  On back of left buttock since 5 days ago that has increased in size, pt with large, hardened area on back of left leg below left buttock, pt states it is moving towards his groin and it very painful. Pt states there has not been any drainage.

## 2020-06-11 NOTE — ED Provider Notes (Signed)
[facility] Emergency Department Provider Note   ____________________________________________   Event Date/Time   First MD Initiated Contact with Patient 06/11/20 1214     (approximate)  I have reviewed the triage vital signs and the nursing notes.   HISTORY  Chief Complaint Abscess    HPI Jeffery Sutton is a 35 y.o. male presents with abscess to the left buttocks for 6 days.  Patient state hurts to walk or sit.  Patient denies drainage from lesion.  Patient rates pain as a 10/10.  Patient described pain as "sore".  No palliative measure for complaint.          Past Medical History:  Diagnosis Date  . Cholecystitis   . Substance abuse (HCC)    Marijuana - Last Use 11/10/15 - Verified 11/11/15    Patient Active Problem List   Diagnosis Date Noted  . Abscess of arm, left 10/30/2015  . Abscess   . Depression 10/14/2013    Past Surgical History:  Procedure Laterality Date  . I & D EXTREMITY Left 10/30/2015   Procedure: IRRIGATION AND DEBRIDEMENT EXTREMITY;  Surgeon: Tiney Rouge III, MD;  Location: [facility] ORS;  Service: General;  Laterality: Left;    Prior to Admission medications   Medication Sig Start Date End Date Taking? Authorizing Provider  naproxen (NAPROSYN) 500 MG tablet Take 1 tablet (500 mg total) by mouth 2 (two) times daily with a meal. 06/11/20  Yes Joni Reining, PA-C  oxyCODONE-acetaminophen (PERCOCET) 7.5-325 MG tablet Take 1 tablet by mouth every 6 (six) hours as needed for up to 5 days for severe pain. 06/11/20 06/16/20 Yes Joni Reining, PA-C  sulfamethoxazole-trimethoprim (BACTRIM DS) 800-160 MG tablet Take 1 tablet by mouth 2 (two) times daily. 06/11/20  Yes Joni Reining, PA-C  HYDROcodone-acetaminophen (NORCO) 5-325 MG tablet Take 1 tablet by mouth every 4 (four) hours as needed for moderate pain. 04/23/19   Willy Eddy, MD  ibuprofen (ADVIL,MOTRIN) 800 MG tablet Take 1 tablet (800 mg total) by mouth every 8 (eight) hours as  needed. 02/10/15   Beers, Charmayne Sheer, PA-C  sulfamethoxazole-trimethoprim (BACTRIM,SEPTRA) 400-80 MG tablet Take 1 tablet by mouth every 12 (twelve) hours. 11/01/15   Leafy Ro, MD    Allergies Penicillins and Zithromax [azithromycin]  Family History  Problem Relation Age of Onset  . Healthy Mother   . Healthy Father   . Diabetes Maternal Grandmother   . Hypertension Paternal Grandmother     Social History Social History   Tobacco Use  . Smoking status: Current Every Day Smoker    Packs/day: 0.50    Types: Cigarettes  . Smokeless tobacco: Never Used  Substance Use Topics  . Alcohol use: Yes    Comment: 2-4 Beers/ Weekly  . Drug use: Yes    Types: Marijuana    Comment: Last Use- 11/10/15 (Verified 11/11/15)    Review of Systems Constitutional: No fever/chills Eyes: No visual changes. ENT: No sore throat. Cardiovascular: Denies chest pain. Respiratory: Denies shortness of breath. Gastrointestinal: No abdominal pain.  No nausea, no vomiting.  No diarrhea.  No constipation. Genitourinary: Negative for dysuria. Musculoskeletal: Negative for back pain. Skin: Nodular lesion on erythematous base of the left buttocks.. Neurological: Negative for headaches, focal weakness or numbness. Allergic/Immunilogical: Penicillin and Zithromax ____________________________________________   PHYSICAL EXAM:  VITAL SIGNS: ED Triage Vitals  Enc Vitals Group     BP 06/11/20 1204 (!) 142/94     Pulse Rate 06/11/20 1204 (!) 108  Resp 06/11/20 1204 18     Temp 06/11/20 1204 99.6 F (37.6 C)     Temp Source 06/11/20 1204 Oral     SpO2 06/11/20 1204 98 %     Weight 06/11/20 1204 170 lb (77.1 kg)     Height 06/11/20 1204 6' (1.829 m)     Head Circumference --      Peak Flow --      Pain Score 06/11/20 1201 10     Pain Loc --      Pain Edu? --      Excl. in GC? --    Constitutional: Alert and oriented. Well appearing and in no acute distress. Cardiovascular: Normal rate, regular  rhythm. Grossly normal heart sounds.  Good peripheral circulation. Respiratory: Normal respiratory effort.  No retractions. Lungs CTAB. Musculoskeletal: No lower extremity tenderness nor edema.  No joint effusions. Neurologic:  Normal speech and language. No gross focal neurologic deficits are appreciated. No gait instability. Skin: 5 cm nodule lesion on erythematous base left buttock.   Psychiatric: Mood and affect are normal. Speech and behavior are normal.  ____________________________________________   LABS (all labs ordered are listed, but only abnormal results are displayed)  Labs Reviewed - No data to display ____________________________________________  EKG   ____________________________________________  RADIOLOGY I, Joni Reining, personally viewed and evaluated these images (plain radiographs) as part of my medical decision making, as well as reviewing the written report by the radiologist.  ED MD interpretation:    Official radiology report(s): No results found.  ____________________________________________   PROCEDURES  Procedure(s) performed (including Critical Care):  Marland KitchenMarland KitchenIncision and Drainage  Date/Time: 06/11/2020 1:42 PM Performed by: Joni Reining, PA-C Authorized by: Joni Reining, PA-C   Consent:    Consent obtained:  Verbal   Consent given by:  Patient   Risks, benefits, and alternatives were discussed: yes     Risks discussed:  Bleeding, incomplete drainage, infection and pain Universal protocol:    Procedure explained and questions answered to patient or proxy's satisfaction: yes     Relevant documents present and verified: yes     Immediately prior to procedure, a time out was called: yes     Patient identity confirmed:  Verbally with patient and arm band Location:    Type:  Abscess   Location:  Lower extremity   Lower extremity location:  Buttock   Buttock location:  L buttock Sedation:    Sedation type:  None Anesthesia:     Anesthesia method:  Local infiltration   Local anesthetic:  Lidocaine 2% WITH epi Procedure type:    Complexity:  Complex Procedure details:    Incision types:  Single with marsupialization   Incision depth:  Dermal   Wound management:  Probed and deloculated   Drainage:  Purulent   Drainage amount:  Copious   Wound treatment:  Wound left open   Packing materials:  1/4 in iodoform gauze Post-procedure details:    Procedure completion:  Tolerated well, no immediate complications     ____________________________________________   INITIAL IMPRESSION / ASSESSMENT AND PLAN / ED COURSE  As part of my medical decision making, I reviewed the following data within the electronic MEDICAL RECORD NUMBER         Patient presents with 4 left buttocks abscess.  See procedure note for incision and drainage.  Patient given discharge care instruction advised return back in 2 days for wound check.  Take medication as directed.  ____________________________________________   FINAL CLINICAL IMPRESSION(S) / ED DIAGNOSES  Final diagnoses:  Cellulitis and abscess of buttock     ED Discharge Orders         Ordered    oxyCODONE-acetaminophen (PERCOCET) 7.5-325 MG tablet  Every 6 hours PRN        06/11/20 1334    sulfamethoxazole-trimethoprim (BACTRIM DS) 800-160 MG tablet  2 times daily        06/11/20 1334    naproxen (NAPROSYN) 500 MG tablet  2 times daily with meals        06/11/20 1334          *Please note:  Saif O Hoheisel was evaluated in Emergency Department on 06/11/2020 for the symptoms described in the history of present illness. He was evaluated in the context of the global COVID-19 pandemic, which necessitated consideration that the patient might be at risk for infection with the SARS-CoV-2 virus that causes COVID-19. Institutional protocols and algorithms that pertain to the evaluation of patients at risk for COVID-19 are in a state of rapid change based on information  released by regulatory bodies including the CDC and federal and state organizations. These policies and algorithms were followed during the patient's care in the ED.  Some ED evaluations and interventions may be delayed as a result of limited staffing during and the pandemic.*   Note:  This document was prepared using Dragon voice recognition software and may include unintentional dictation errors.    Joni Reining, PA-C 06/11/20 1344    Sharyn Creamer, MD 06/13/20 1135

## 2020-06-11 NOTE — ED Triage Notes (Signed)
Pt reports abscess to backside for last 6 days and hurts to sit or walk

## 2020-06-11 NOTE — ED Notes (Signed)
Patient declined discharge vital signs. 

## 2020-06-11 NOTE — ED Notes (Signed)
EDP in room with pt 

## 2020-06-13 ENCOUNTER — Emergency Department
Admission: EM | Admit: 2020-06-13 | Discharge: 2020-06-13 | Disposition: A | Discharge status: Home / Self Care | Payer: Self-pay | Attending: Emergency Medicine | Admitting: Emergency Medicine

## 2020-06-13 ENCOUNTER — Other Ambulatory Visit: Payer: Self-pay

## 2020-06-13 DIAGNOSIS — L0231 Cutaneous abscess of buttock: Secondary | ICD-10-CM | POA: Insufficient documentation

## 2020-06-13 DIAGNOSIS — F17210 Nicotine dependence, cigarettes, uncomplicated: Secondary | ICD-10-CM | POA: Insufficient documentation

## 2020-06-13 MED ORDER — CEFTRIAXONE SODIUM 1 G IJ SOLR
1.0000 g | Freq: Once | INTRAMUSCULAR | Status: AC
  Administered 2020-06-13: 1 g via INTRAMUSCULAR
  Filled 2020-06-13: qty 10

## 2020-06-13 NOTE — ED Triage Notes (Signed)
Pt states he was here Saturday and had abscess to buttocks drained and packed. Pt was told to return today for recheck.

## 2020-06-13 NOTE — ED Provider Notes (Signed)
[facility] Emergency Department Provider Note  ____________________________________________   Event Date/Time   First MD Initiated Contact with Patient 06/13/20 1630     (approximate)  I have reviewed the triage vital signs and the nursing notes.   HISTORY  Chief Complaint Abscess  HPI Jeffery Sutton is a 35 y.o. male who presents to the emergency department today for evaluation of left-sided buttock abscess that was evaluated, drained and packed 2 days ago.  Patient states that he continues to not have any fevers or chills, but has had significant difficulty sitting on the area due to the pain.  He reports that he is taking his Bactrim as prescribed.  He denies any anal discomfort, but states that is primarily on the buttock.         Past Medical History:  Diagnosis Date  . Cholecystitis   . Substance abuse (HCC)    Marijuana - Last Use 11/10/15 - Verified 11/11/15    Patient Active Problem List   Diagnosis Date Noted  . Abscess of arm, left 10/30/2015  . Abscess   . Depression 10/14/2013    Past Surgical History:  Procedure Laterality Date  . I & D EXTREMITY Left 10/30/2015   Procedure: IRRIGATION AND DEBRIDEMENT EXTREMITY;  Surgeon: Tiney Rouge III, MD;  Location: [facility] ORS;  Service: General;  Laterality: Left;    Prior to Admission medications   Medication Sig Start Date End Date Taking? Authorizing Provider  HYDROcodone-acetaminophen (NORCO) 5-325 MG tablet Take 1 tablet by mouth every 4 (four) hours as needed for moderate pain. 04/23/19   Willy Eddy, MD  ibuprofen (ADVIL,MOTRIN) 800 MG tablet Take 1 tablet (800 mg total) by mouth every 8 (eight) hours as needed. 02/10/15   Beers, Charmayne Sheer, PA-C  naproxen (NAPROSYN) 500 MG tablet Take 1 tablet (500 mg total) by mouth 2 (two) times daily with a meal. 06/11/20   Joni Reining, PA-C  oxyCODONE-acetaminophen (PERCOCET) 7.5-325 MG tablet Take 1 tablet by mouth every 6 (six) hours as needed  for up to 5 days for severe pain. 06/11/20 06/16/20  Joni Reining, PA-C  sulfamethoxazole-trimethoprim (BACTRIM DS) 800-160 MG tablet Take 1 tablet by mouth 2 (two) times daily. 06/11/20   Joni Reining, PA-C  sulfamethoxazole-trimethoprim (BACTRIM,SEPTRA) 400-80 MG tablet Take 1 tablet by mouth every 12 (twelve) hours. 11/01/15   Leafy Ro, MD    Allergies Penicillins and Zithromax [azithromycin]  Family History  Problem Relation Age of Onset  . Healthy Mother   . Healthy Father   . Diabetes Maternal Grandmother   . Hypertension Paternal Grandmother     Social History Social History   Tobacco Use  . Smoking status: Current Every Day Smoker    Packs/day: 0.50    Types: Cigarettes  . Smokeless tobacco: Never Used  Substance Use Topics  . Alcohol use: Yes    Comment: 2-4 Beers/ Weekly  . Drug use: Yes    Types: Marijuana    Comment: Last Use- 11/10/15 (Verified 11/11/15)    Review of Systems Constitutional: No fever/chills Eyes: No visual changes. ENT: No sore throat. Cardiovascular: Denies chest pain. Respiratory: Denies shortness of breath. Gastrointestinal: No abdominal pain.  No nausea, no vomiting.  No diarrhea.  No constipation. Genitourinary: Negative for dysuria. Musculoskeletal: Negative for back pain. Skin: + Left buttock abscess Neurological: Negative for headaches, focal weakness or numbness.   ____________________________________________   PHYSICAL EXAM:  VITAL SIGNS: ED Triage Vitals [06/13/20 1530]  Enc  Vitals Group     BP (!) 134/101     Pulse Rate 88     Resp 20     Temp 98.7 F (37.1 C)     Temp Source Oral     SpO2 100 %     Weight 170 lb (77.1 kg)     Height 6' (1.829 m)     Head Circumference      Peak Flow      Pain Score 6     Pain Loc      Pain Edu?      Excl. in GC?    Constitutional: Alert and oriented. Well appearing and in no acute distress. Eyes: Conjunctivae are normal. PERRL. EOMI. Head: Atraumatic. Nose: No  congestion/rhinnorhea. Mouth/Throat: Mucous membranes are moist.  Oropharynx non-erythematous. Neck: No stridor.   Cardiovascular: Normal rate, regular rhythm. Grossly normal heart sounds.  Good peripheral circulation. Respiratory: Normal respiratory effort.  No retractions. Lungs CTAB. Gastrointestinal: Soft and nontender. No distention. No abdominal bruits. No CVA tenderness.  Musculoskeletal: No lower extremity tenderness nor edema.  No joint effusions. Neurologic:  Normal speech and language. No gross focal neurologic deficits are appreciated. No gait instability. Skin: There is a 1 cm x 1 cm open abscess with packing intact located in the lower left buttock.  Packing was removed, and demonstrates a 1 cm deep abscess.  There is significant surrounding induration extending approximately 2-1/2 inches in each direction.  There is no induration or tenderness extending to the rectum or past the gluteal fold.  No rash noted. Psychiatric: Mood and affect are normal. Speech and behavior are normal.   ____________________________________________   INITIAL IMPRESSION / ASSESSMENT AND PLAN / ED COURSE  As part of my medical decision making, I reviewed the following data within the electronic MEDICAL RECORD NUMBER Nursing notes reviewed and incorporated and Notes from prior ED visits        Patient is a 35 year old male who presents to the emergency department for packing removal and repeat evaluation of the left buttock abscess.  Upon evaluation, there is continued extensive induration surrounding the open abscess, however the abscess remains open and draining at this time.  Will provide the patient 1 dose of IM Rocephin here in addition to his Bactrim coverage to try to facilitate improvement.  Advised the patient to keep a close eye on this and if he exhibits any worsening of symptoms, growth of the abscess, fevers or chills, he is to return.  The patient does have a history of penicillin allergy with  hives, he did not experience any shortness of breath, cough or other symptoms with this.  Discussed the low likelihood of cross-reactivity with Rocephin, and the patient is amenable to trying this.  Will avoid prescription of Keflex at this time given higher concern for cross-reactivity, however may consider changing to clindamycin or other antibiotic coverage if Bactrim fails to improve his symptoms.  The patient is amenable with this plan, he stable this time for outpatient follow-up.      ____________________________________________   FINAL CLINICAL IMPRESSION(S) / ED DIAGNOSES  Final diagnoses:  Left buttock abscess     ED Discharge Orders    None      *Please note:  Jeffery Sutton was evaluated in Emergency Department on 06/13/2020 for the symptoms described in the history of present illness. He was evaluated in the context of the global COVID-19 pandemic, which necessitated consideration that the patient might be at risk for infection  with the SARS-CoV-2 virus that causes COVID-19. Institutional protocols and algorithms that pertain to the evaluation of patients at risk for COVID-19 are in a state of rapid change based on information released by regulatory bodies including the CDC and federal and state organizations. These policies and algorithms were followed during the patient's care in the ED.  Some ED evaluations and interventions may be delayed as a result of limited staffing during and the pandemic.*   Note:  This document was prepared using Dragon voice recognition software and may include unintentional dictation errors.   Lucy Chris, PA 06/13/20 2034    Gilles Chiquito, MD 06/13/20 2053

## 2020-06-13 NOTE — Discharge Instructions (Signed)
Please follow-up with primary care or return to the ER for recheck in 2 to 3 days if unable to see primary care.

## 2020-06-22 ENCOUNTER — Other Ambulatory Visit: Payer: Self-pay

## 2020-06-22 ENCOUNTER — Encounter: Payer: Self-pay | Admitting: Family Medicine

## 2020-06-22 ENCOUNTER — Ambulatory Visit: Payer: Self-pay | Admitting: Family Medicine

## 2020-06-22 DIAGNOSIS — L0231 Cutaneous abscess of buttock: Secondary | ICD-10-CM

## 2020-06-22 DIAGNOSIS — Z113 Encounter for screening for infections with a predominantly sexual mode of transmission: Secondary | ICD-10-CM

## 2020-06-22 NOTE — Progress Notes (Signed)
[facility] Department STI clinic/screening visit  Subjective:  Jeffery Sutton is a 35 y.o. male being seen today for an STI screening visit. The patient reports they do have symptoms.    Patient has the following medical conditions:   Patient Active Problem List   Diagnosis Date Noted  . Abscess of arm, left 10/30/2015  . Abscess   . Depression 10/14/2013     Chief Complaint  Patient presents with  . SEXUALLY TRANSMITTED DISEASE    HPI  Patient reports having discharge x 1 week,  Patient also reports boil that was assessed at the [facility] ER on 06/18/20   See flowsheet for further details and programmatic requirements.    The following portions of the patient's history were reviewed and updated as appropriate: allergies, current medications, past medical history, past social history, past surgical history and problem list.  Objective:  There were no vitals filed for this visit.  Physical Exam Constitutional:      Appearance: Normal appearance.  HENT:     Head: Normocephalic.     Mouth/Throat:     Mouth: Mucous membranes are moist.     Pharynx: Oropharynx is clear. No oropharyngeal exudate.  Genitourinary:    Penis: Normal.      Testes: Normal.     Rectum: Normal.       Comments: No lice, nits, or pest, no lesions or odor discharge.  Denies pain or tenderness with paplation of testicles.  No penile lesions, ulcers or masses present.      Musculoskeletal:     Cervical back: Normal range of motion.  Lymphadenopathy:     Cervical: No cervical adenopathy.  Skin:    General: Skin is warm and dry.     Findings: Rash present. No bruising, erythema or lesion.     Comments: Patient has skin rash.  Macules scatter over trunk and arms.  Patient reports have had for last 3-4 years.    Neurological:     Mental Status: He is alert and oriented to person, place, and time.  Psychiatric:        Mood and Affect: Mood normal.        Behavior: Behavior normal.        Assessment and Plan:  MYLZ YUAN is a 35 y.o. male presenting to the [facility] Department for STI screening  1. Screening examination for venereal disease  - Chlamydia/Gonorrhea [place] [facility] - HBV Antigen/Antibody [facility] - HIV/HCV [place] [facility] - Syphilis Serology, [place] [facility] - Chlamydia/Gonorrhea [place] [facility] - Chlamydia/Gonorrhea [place] [facility]  Patient does have STI symptoms Patient accepted all screenings including   oral, urine, rectal CT/GC and bloodwork for HIV/RPR.  Patient meets criteria for HepB screening? Yes. Ordered? Yes Patient meets criteria for HepC screening? Yes. Ordered? Yes Recommended condom use with all sex Discussed importance of condom use for STI prevent  2. Abscess of left buttock EMR record of patient visit to [facility] ER for abscess of L buttock.   Per [facility]  1 cm deep abscess.  There is significant surrounding induration extending approximately 2-1/2 inches in each direction.  There is no induration or tenderness extending to the rectum or past the gluteal fold.  Dressing removed for today's assessment.  Assessment today note that not much change in abscess.    No gram stain ordered today, waiting on State lab result.   Discussed time line for [facility] Lab results and that patient will  be called with positive results and encouraged patient to call if he had not heard in 2 [place] Recommended returning for continued or worsening symptoms.    No follow-ups on file.  No future appointments.  Wendi Snipes, FNP

## 2020-07-08 ENCOUNTER — Telehealth: Payer: Self-pay

## 2020-07-08 NOTE — Telephone Encounter (Signed)
Attempt #2 to reach pt regarding test results for hep C. LMTRC.

## 2020-07-11 ENCOUNTER — Telehealth: Payer: Self-pay

## 2020-07-11 LAB — HEPATITIS B SURFACE ANTIGEN

## 2020-07-11 NOTE — Telephone Encounter (Signed)
Attempted to contact pt regarding postive Hep C results. LMTRC. Letter mailed .

## 2020-07-28 ENCOUNTER — Emergency Department: Payer: Self-pay

## 2020-07-28 ENCOUNTER — Emergency Department
Admission: EM | Admit: 2020-07-28 | Discharge: 2020-07-28 | Disposition: A | Discharge status: Home / Self Care | Payer: Self-pay | Attending: Emergency Medicine | Admitting: Emergency Medicine

## 2020-07-28 ENCOUNTER — Other Ambulatory Visit: Payer: Self-pay

## 2020-07-28 DIAGNOSIS — R1031 Right lower quadrant pain: Secondary | ICD-10-CM | POA: Insufficient documentation

## 2020-07-28 DIAGNOSIS — R109 Unspecified abdominal pain: Secondary | ICD-10-CM

## 2020-07-28 DIAGNOSIS — Z87891 Personal history of nicotine dependence: Secondary | ICD-10-CM | POA: Insufficient documentation

## 2020-07-28 DIAGNOSIS — R10A0 Flank pain, unspecified side: Secondary | ICD-10-CM

## 2020-07-28 DIAGNOSIS — N50811 Right testicular pain: Secondary | ICD-10-CM | POA: Insufficient documentation

## 2020-07-28 DIAGNOSIS — N50819 Testicular pain, unspecified: Secondary | ICD-10-CM

## 2020-07-28 LAB — URINALYSIS, COMPLETE (UACMP) WITH MICROSCOPIC
Bacteria, UA: NONE SEEN
Bilirubin Urine: NEGATIVE
Glucose, UA: NEGATIVE mg/dL
Hgb urine dipstick: NEGATIVE
Ketones, ur: NEGATIVE mg/dL
Leukocytes,Ua: NEGATIVE
Nitrite: NEGATIVE
Protein, ur: NEGATIVE mg/dL
Specific Gravity, Urine: 1.01 (ref 1.005–1.030)
Squamous Epithelial / LPF: NONE SEEN (ref 0–5)
pH: 7 (ref 5.0–8.0)

## 2020-07-28 LAB — CBC WITH DIFFERENTIAL/PLATELET
Abs Immature Granulocytes: 0.03 10*3/uL (ref 0.00–0.07)
Basophils Absolute: 0 10*3/uL (ref 0.0–0.1)
Basophils Relative: 0 %
Eosinophils Absolute: 0.1 10*3/uL (ref 0.0–0.5)
Eosinophils Relative: 1 %
HCT: 41.9 % (ref 39.0–52.0)
Hemoglobin: 13.8 g/dL (ref 13.0–17.0)
Immature Granulocytes: 0 %
Lymphocytes Relative: 25 %
Lymphs Abs: 2.2 10*3/uL (ref 0.7–4.0)
MCH: 27.2 pg (ref 26.0–34.0)
MCHC: 32.9 g/dL (ref 30.0–36.0)
MCV: 82.6 fL (ref 80.0–100.0)
Monocytes Absolute: 0.9 10*3/uL (ref 0.1–1.0)
Monocytes Relative: 10 %
Neutro Abs: 5.8 10*3/uL (ref 1.7–7.7)
Neutrophils Relative %: 64 %
Platelets: 326 10*3/uL (ref 150–400)
RBC: 5.07 MIL/uL (ref 4.22–5.81)
RDW: 14.5 % (ref 11.5–15.5)
WBC: 9 10*3/uL (ref 4.0–10.5)
nRBC: 0 % (ref 0.0–0.2)

## 2020-07-28 LAB — COMPREHENSIVE METABOLIC PANEL
ALT: 25 U/L (ref 0–44)
AST: 20 U/L (ref 15–41)
Albumin: 4.1 g/dL (ref 3.5–5.0)
Alkaline Phosphatase: 74 U/L (ref 38–126)
Anion gap: 8 (ref 5–15)
BUN: 7 mg/dL (ref 6–20)
CO2: 27 mmol/L (ref 22–32)
Calcium: 9.1 mg/dL (ref 8.9–10.3)
Chloride: 97 mmol/L — ABNORMAL LOW (ref 98–111)
Creatinine, Ser: 0.74 mg/dL (ref 0.61–1.24)
GFR, Estimated: >60 mL/min (ref >60)
Glucose, Bld: 78 mg/dL (ref 70–99)
Potassium: 3.6 mmol/L (ref 3.5–5.1)
Sodium: 132 mmol/L — ABNORMAL LOW (ref 135–145)
Total Bilirubin: 0.5 mg/dL (ref 0.3–1.2)
Total Protein: 7.9 g/dL (ref 6.5–8.1)

## 2020-07-28 MED ORDER — KETOROLAC TROMETHAMINE 30 MG/ML IJ SOLN
30.0000 mg | Freq: Once | INTRAMUSCULAR | Status: AC
  Administered 2020-07-28: 30 mg via INTRAMUSCULAR
  Filled 2020-07-28: qty 1

## 2020-07-28 MED ORDER — FENTANYL CITRATE (PF) 100 MCG/2ML IJ SOLN
50.0000 ug | Freq: Once | INTRAMUSCULAR | Status: AC
  Administered 2020-07-28: 50 ug via INTRAMUSCULAR
  Filled 2020-07-28: qty 2

## 2020-07-28 MED ORDER — KETOROLAC TROMETHAMINE 10 MG PO TABS: 10.0000 mg | ORAL_TABLET | Freq: Four times a day (QID) | ORAL | 0 refills | Status: AC | PRN

## 2020-07-28 MED ORDER — ONDANSETRON 4 MG PO TBDP
4.0000 mg | ORAL_TABLET | Freq: Once | ORAL | Status: AC
Start: 2020-07-28 — End: 2020-07-28
  Administered 2020-07-28: 4 mg via ORAL
  Filled 2020-07-28: qty 1

## 2020-07-28 NOTE — ED Provider Notes (Signed)
[facility]-EMERGENCY DEPARTMENT  ____________________________________________  Time seen: Approximately 3:18 PM  I have reviewed the triage vital signs and the nursing notes.   HISTORY  Chief Complaint Flank Pain   Historian Patient     HPI Jeffery Sutton is a 35 y.o. male presents to the emergency department with 10 out of 10 right-sided flank pain that radiates to the right lower quadrant and right testicle.  Patient has had some nausea but no vomiting.  He denies hematuria.  He denies a history of nephrolithiasis or pyelonephritis.  He has been taking Tylenol for pain.   Past Medical History:  Diagnosis Date  . Cholecystitis   . Substance abuse (HCC)    Marijuana - Last Use 11/10/15 - Verified 11/11/15     Immunizations up to date:  Yes.     Past Medical History:  Diagnosis Date  . Cholecystitis   . Substance abuse (HCC)    Marijuana - Last Use 11/10/15 - Verified 11/11/15    Patient Active Problem List   Diagnosis Date Noted  . Abscess of arm, left 10/30/2015  . Abscess   . Depression 10/14/2013    Past Surgical History:  Procedure Laterality Date  . I & D EXTREMITY Left 10/30/2015   Procedure: IRRIGATION AND DEBRIDEMENT EXTREMITY;  Surgeon: Tiney Rouge III, MD;  Location: [facility] ORS;  Service: General;  Laterality: Left;    Prior to Admission medications   Medication Sig Start Date End Date Taking? Authorizing Provider  ketorolac (TORADOL) 10 MG tablet Take 1 tablet (10 mg total) by mouth every 6 (six) hours as needed for up to 5 days. 07/28/20 08/02/20 Yes Pia Mau M, PA-C    Allergies Penicillins and Zithromax [azithromycin]  Family History  Problem Relation Age of Onset  . Healthy Mother   . Healthy Father   . Diabetes Maternal Grandmother   . Hypertension Paternal Grandmother     Social History Social History   Tobacco Use  . Smoking status: Former Smoker    Packs/day: 0.50    Types: Cigarettes    Quit date: 02/10/2020    Years since  quitting: 0.4  . Smokeless tobacco: Never Used  Vaping Use  . Vaping Use: Every day  . Start date: 02/10/2020  . Substances: Nicotine, Flavoring  Substance Use Topics  . Alcohol use: Not Currently    Comment: last drink was 02/2020  . Drug use: Yes    Frequency: 7.0 times per week    Types: Marijuana     Review of Systems  Constitutional: No fever/chills Eyes:  No discharge ENT: No upper respiratory complaints. Respiratory: no cough. No SOB/ use of accessory muscles to breath Gastrointestinal:   No nausea, no vomiting.  No diarrhea.  No constipation. Genitourinary: Patient has right sided flank pain.  Musculoskeletal: Negative for musculoskeletal pain. Skin: Negative for rash, abrasions, lacerations, ecchymosis.    ____________________________________________   PHYSICAL EXAM:  VITAL SIGNS: ED Triage Vitals  Enc Vitals Group     BP 07/28/20 1331 138/82     Pulse Rate 07/28/20 1331 84     Resp 07/28/20 1331 18     Temp 07/28/20 1331 98.3 F (36.8 C)     Temp Source 07/28/20 1331 Oral     SpO2 07/28/20 1331 100 %     Weight 07/28/20 1332 175 lb (79.4 kg)     Height 07/28/20 1332 6' (1.829 m)     Head Circumference --      Peak Flow --  Pain Score 07/28/20 1331 6     Pain Loc --      Pain Edu? --      Excl. in GC? --      Constitutional: Alert and oriented. Well appearing and in no acute distress. Eyes: Conjunctivae are normal. PERRL. EOMI. Head: Atraumatic. ENT: Cardiovascular: Normal rate, regular rhythm. Normal S1 and S2.  Good peripheral circulation. Respiratory: Normal respiratory effort without tachypnea or retractions. Lungs CTAB. Good air entry to the bases with no decreased or absent breath sounds Gastrointestinal: Bowel sounds x 4 quadrants. Soft and nontender to palpation. No guarding or rigidity. No distention. Patient has right sided CVA tenderness.  Musculoskeletal: Full range of motion to all extremities. No obvious deformities  noted Neurologic:  Normal for age. No gross focal neurologic deficits are appreciated.  Skin:  Skin is warm, dry and intact. No rash noted. Psychiatric: Mood and affect are normal for age. Speech and behavior are normal.   ____________________________________________   LABS (all labs ordered are listed, but only abnormal results are displayed)  Labs Reviewed  URINALYSIS, COMPLETE (UACMP) WITH MICROSCOPIC - Abnormal; Notable for the following components:      Result Value   Color, Urine STRAW (*)    APPearance CLEAR (*)    All other components within normal limits  COMPREHENSIVE METABOLIC PANEL - Abnormal; Notable for the following components:   Sodium 132 (*)    Chloride 97 (*)    All other components within normal limits  CBC WITH DIFFERENTIAL/PLATELET   ____________________________________________  EKG   ____________________________________________  RADIOLOGY Geraldo Pitter, personally viewed and evaluated these images (plain radiographs) as part of my medical decision making, as well as reviewing the written report by the radiologist.    CT Renal Stone Study  Result Date: 07/28/2020 CLINICAL DATA:  Right flank pain which began Sunday calming moving towards the right groin, hematuria and difficulty with urination, cloudy urine EXAM: CT ABDOMEN AND PELVIS WITHOUT CONTRAST TECHNIQUE: Multidetector CT imaging of the abdomen and pelvis was performed following the standard protocol without IV contrast. COMPARISON:  Scrotal ultrasound 04/23/2019 FINDINGS: Lower chest: Lung bases are clear. Normal heart size. No pericardial effusion. Hepatobiliary: No visible focal liver lesion within the limitations of this unenhanced exam. Smooth liver surface contour. Normal hepatic attenuation. Normal gallbladder and biliary tree. Pancreas: No pancreatic ductal dilatation or surrounding inflammatory changes. Spleen: Normal in size. No concerning splenic lesions. Adrenals/Urinary Tract: Normal  adrenal glands. Kidneys are symmetric in size and normally located. No visible or contour deforming renal lesions. No visible urolithiasis or evidence of obstructive uropathy is seen at this time. Urinary bladder is unremarkable for the degree of distention without visible bladder calculi or debris. Stomach/Bowel: Distal esophagus, stomach and duodenum are unremarkable. No large or small bowel thickening or dilatation. Moderate colonic stool burden. Noninflamed normal caliber appendix in the right lower quadrant. No evidence of bowel obstruction. Vascular/Lymphatic: No significant vascular findings are present. No enlarged abdominal or pelvic lymph nodes. Reproductive: The prostate and seminal vesicles are unremarkable. Other: No abdominopelvic free fluid or free gas. No bowel containing hernias. Musculoskeletal: No acute osseous abnormality or suspicious osseous lesion. IMPRESSION: No visible urolithiasis, obstructive uropathy, or other CT evident urinary tract abnormality is seen at this time. No other findings to provide a cause for patient's symptoms. Electronically Signed   By: Kreg Shropshire M.D.   On: 07/28/2020 15:46   US SCROTUM W/DOPPLER  Result Date: 07/28/2020 CLINICAL DATA:  Testicle pain for  1 day EXAM: SCROTAL ULTRASOUND DOPPLER ULTRASOUND OF THE TESTICLES TECHNIQUE: Complete ultrasound examination of the testicles, epididymis, and other scrotal structures was performed. Color and spectral Doppler ultrasound were also utilized to evaluate blood flow to the testicles. COMPARISON:  04/23/2019 FINDINGS: Right testicle Measurements: 4.2 x 2.4 x 3.3 cm. Scattered punctate calcifications throughout the testicle. No concerning focal testicular lesion. Left testicle Measurements: 4.6 x 2.0 x 2.9 cm. Scattered punctate calcifications throughout the testicle. No concerning focal testicular lesion. Right epididymis: Slightly enlarged, heterogeneous appearance of the right epididymis with increased color  vascular flow. Left epididymis:  Normal in size and appearance. Hydrocele:  Small right hydrocele, possibly reactive. Varicocele: Not appreciable on today's examination Pulsed Doppler interrogation of both testes demonstrates symmetric color flow to the testes with normal low resistance arterial and venous waveforms bilaterally. Asymmetrically increased color flow to the right epididymis. IMPRESSION: Heterogeneous, hypervascular right epididymis, compatible with an epididymitis in the appropriate clinical setting. Of note, a similar appearance was seen on comparison ultrasound 04/23/2019 Suspect a small reactive right hydrocele as well. Bilateral testicular microlithiasis. Current literature suggests that testicular microlithiasis is not a significant independent risk factor for development of testicular carcinoma, and that follow up imaging is not warranted in the absence of other risk factors. Monthly testicular self-examination and annual physical exams are considered appropriate surveillance. If patient has other risk factors for testicular carcinoma, then referral to Urology should be considered. (Reference: DeCastro, et al.: A 5-Year Follow up Study of Asymptomatic Men with Testicular Microlithiasis. J Urol 2008; 179:1420-1423.) Electronically Signed   By: Kreg Shropshire M.D.   On: 07/28/2020 18:01    ____________________________________________    PROCEDURES  Procedure(s) performed:     Procedures     Medications  fentaNYL (SUBLIMAZE) injection 50 mcg (50 mcg Intramuscular Given 07/28/20 1548)  ondansetron (ZOFRAN-ODT) disintegrating tablet 4 mg (4 mg Oral Given 07/28/20 1548)  ketorolac (TORADOL) 30 MG/ML injection 30 mg (30 mg Intramuscular Given 07/28/20 1811)     ____________________________________________   INITIAL IMPRESSION / ASSESSMENT AND PLAN / ED COURSE  Pertinent labs & imaging results that were available during my care of the patient were reviewed by me and considered in  my medical decision making (see chart for details).      Assessment and Plan:  Flank pain:  35 year old male presents to the emergency department with right-sided flank pain for the past 2 days.  Vital signs were reassuring at triage.  CBC and CMP were reassuring.  Urinalysis showed no signs of UTI or hematuria.  CT renal stone study showed no evidence of nephrolithiasis.  Scrotal ultrasound showed no signs of torsion.  Patient was given Toradol prior to discharge and was discharged with a short course of Toradol.      ____________________________________________  FINAL CLINICAL IMPRESSION(S) / ED DIAGNOSES  Final diagnoses:  Testicle pain  Flank pain      NEW MEDICATIONS STARTED DURING THIS VISIT:  ED Discharge Orders         Ordered    ketorolac (TORADOL) 10 MG tablet  Every 6 hours PRN        07/28/20 1806              This chart was dictated using voice recognition software/Dragon. Despite best efforts to proofread, errors can occur which can change the meaning. Any change was purely unintentional.     Orvil Feil, PA-C 07/28/20 1856    Phineas Semen, MD 07/28/20 [phone]

## 2020-07-28 NOTE — Discharge Instructions (Addendum)
You can take Toradol up to four times daily for the next five days.  

## 2020-07-28 NOTE — ED Triage Notes (Signed)
Pt states pain started Sunday in right flank and coming forward to right groin. Pt denies blood in urine and pain with urination. Pt states urine looks cloudy. Pt denies N/V/D.

## 2020-07-28 NOTE — ED Notes (Signed)
See triage note  Presents with right flank pain for couple of days  States pain did ease off with tylenol  But this am staets pain radiates into groin area

## 2020-08-06 ENCOUNTER — Emergency Department
Admission: EM | Admit: 2020-08-06 | Discharge: 2020-08-06 | Disposition: A | Discharge status: Home / Self Care | Payer: Self-pay | Attending: Emergency Medicine | Admitting: Emergency Medicine

## 2020-08-06 ENCOUNTER — Encounter: Payer: Self-pay | Admitting: Emergency Medicine

## 2020-08-06 ENCOUNTER — Other Ambulatory Visit: Payer: Self-pay

## 2020-08-06 ENCOUNTER — Emergency Department: Payer: Self-pay

## 2020-08-06 DIAGNOSIS — Z87891 Personal history of nicotine dependence: Secondary | ICD-10-CM | POA: Insufficient documentation

## 2020-08-06 DIAGNOSIS — N451 Epididymitis: Secondary | ICD-10-CM

## 2020-08-06 DIAGNOSIS — N433 Hydrocele, unspecified: Secondary | ICD-10-CM

## 2020-08-06 DIAGNOSIS — N50819 Testicular pain, unspecified: Secondary | ICD-10-CM

## 2020-08-06 LAB — URINALYSIS, COMPLETE (UACMP) WITH MICROSCOPIC
Bacteria, UA: NONE SEEN
Bilirubin Urine: NEGATIVE
Glucose, UA: NEGATIVE mg/dL
Hgb urine dipstick: NEGATIVE
Ketones, ur: NEGATIVE mg/dL
Leukocytes,Ua: NEGATIVE
Nitrite: NEGATIVE
Protein, ur: NEGATIVE mg/dL
Specific Gravity, Urine: 1.005 (ref 1.005–1.030)
Squamous Epithelial / HPF: NONE SEEN (ref 0–5)
pH: 7 (ref 5.0–8.0)

## 2020-08-06 LAB — COMPREHENSIVE METABOLIC PANEL
ALT: 27 U/L (ref 0–44)
AST: 20 U/L (ref 15–41)
Albumin: 4.1 g/dL (ref 3.5–5.0)
Alkaline Phosphatase: 75 U/L (ref 38–126)
Anion gap: 8 (ref 5–15)
BUN: 8 mg/dL (ref 6–20)
CO2: 28 mmol/L (ref 22–32)
Calcium: 9.7 mg/dL (ref 8.9–10.3)
Chloride: 102 mmol/L (ref 98–111)
Creatinine, Ser: 0.99 mg/dL (ref 0.61–1.24)
GFR, Estimated: >60 mL/min (ref >60)
Glucose, Bld: 92 mg/dL (ref 70–99)
Potassium: 4.1 mmol/L (ref 3.5–5.1)
Sodium: 138 mmol/L (ref 135–145)
Total Bilirubin: 0.5 mg/dL (ref 0.3–1.2)
Total Protein: 8.6 g/dL — ABNORMAL HIGH (ref 6.5–8.1)

## 2020-08-06 LAB — CBC WITH DIFFERENTIAL/PLATELET
Abs Immature Granulocytes: 0.03 10*3/uL (ref 0.00–0.07)
Basophils Absolute: 0 10*3/uL (ref 0.0–0.1)
Basophils Relative: 0 %
Eosinophils Absolute: 0.1 10*3/uL (ref 0.0–0.5)
Eosinophils Relative: 1 %
HCT: 44.6 % (ref 39.0–52.0)
Hemoglobin: 15.1 g/dL (ref 13.0–17.0)
Immature Granulocytes: 0 %
Lymphocytes Relative: 31 %
Lymphs Abs: 3 10*3/uL (ref 0.7–4.0)
MCH: 27.7 pg (ref 26.0–34.0)
MCHC: 33.9 g/dL (ref 30.0–36.0)
MCV: 81.8 fL (ref 80.0–100.0)
Monocytes Absolute: 0.8 10*3/uL (ref 0.1–1.0)
Monocytes Relative: 8 %
Neutro Abs: 5.8 10*3/uL (ref 1.7–7.7)
Neutrophils Relative %: 60 %
Platelets: 467 10*3/uL — ABNORMAL HIGH (ref 150–400)
RBC: 5.45 MIL/uL (ref 4.22–5.81)
RDW: 14.3 % (ref 11.5–15.5)
WBC: 9.8 10*3/uL (ref 4.0–10.5)
nRBC: 0 % (ref 0.0–0.2)

## 2020-08-06 LAB — CHLAMYDIA/NGC RT PCR (ARMC ONLY)
Chlamydia Tr: NOT DETECTED
N gonorrhoeae: NOT DETECTED

## 2020-08-06 MED ORDER — MORPHINE SULFATE (PF) 4 MG/ML IV SOLN
4.0000 mg | Freq: Once | INTRAVENOUS | Status: AC
Start: 2020-08-06 — End: 2020-08-06
  Administered 2020-08-06: 4 mg via INTRAVENOUS
  Filled 2020-08-06: qty 1

## 2020-08-06 MED ORDER — HYDROCODONE-ACETAMINOPHEN 5-325 MG PO TABS: 1.0000 | ORAL_TABLET | Freq: Four times a day (QID) | ORAL | 0 refills | Status: DC | PRN

## 2020-08-06 MED ORDER — IBUPROFEN 800 MG PO TABS: 800.0000 mg | ORAL_TABLET | Freq: Three times a day (TID) | ORAL | 0 refills | Status: DC | PRN

## 2020-08-06 MED ORDER — SODIUM CHLORIDE 0.9 % IV SOLN
1.0000 g | Freq: Once | INTRAVENOUS | Status: AC
  Administered 2020-08-06: 1 g via INTRAVENOUS
  Filled 2020-08-06: qty 10

## 2020-08-06 MED ORDER — ONDANSETRON HCL 4 MG/2ML IJ SOLN
4.0000 mg | Freq: Once | INTRAMUSCULAR | Status: AC
  Administered 2020-08-06: 4 mg via INTRAVENOUS
  Filled 2020-08-06: qty 2

## 2020-08-06 MED ORDER — DOXYCYCLINE MONOHYDRATE 100 MG PO TABS: 100.0000 mg | ORAL_TABLET | Freq: Two times a day (BID) | ORAL | 0 refills | Status: AC

## 2020-08-06 NOTE — ED Provider Notes (Signed)
[facility] Emergency Department Provider Note  ____________________________________________   Event Date/Time   First MD Initiated Contact with Patient 08/06/20 [phone]     (approximate)  I have reviewed the triage vital signs and the nursing notes.   HISTORY  Chief Complaint Testicle Pain    HPI Jeffery Sutton is a 35 y.o. male presents  to the emergency department complaining of testicular pain.  States he has had pain in the right testicle for a little over a week.  He was seen here and told he did not have a torsion but was given pain medication.  He denies any dysuria or hematuria.   Past Medical History:  Diagnosis Date  . Cholecystitis   . Substance abuse (HCC)    Marijuana - Last Use 11/10/15 - Verified 11/11/15    Patient Active Problem List   Diagnosis Date Noted  . Abscess of arm, left 10/30/2015  . Abscess   . Depression 10/14/2013    Past Surgical History:  Procedure Laterality Date  . I & D EXTREMITY Left 10/30/2015   Procedure: IRRIGATION AND DEBRIDEMENT EXTREMITY;  Surgeon: Tiney Rouge III, MD;  Location: [facility] ORS;  Service: General;  Laterality: Left;    Prior to Admission medications   Medication Sig Start Date End Date Taking? Authorizing Provider  doxycycline (ADOXA) 100 MG tablet Take 1 tablet (100 mg total) by mouth 2 (two) times daily for 14 days. 08/06/20 08/20/20 Yes Alene Bergerson, Roselyn Bering, PA-C  HYDROcodone-acetaminophen (NORCO/VICODIN) 5-325 MG tablet Take 1 tablet by mouth every 6 (six) hours as needed for moderate pain. 08/06/20  Yes Gianno Volner, Roselyn Bering, PA-C  ibuprofen (ADVIL) 800 MG tablet Take 1 tablet (800 mg total) by mouth every 8 (eight) hours as needed. 08/06/20  Yes Iley Deignan, Roselyn Bering, PA-C    Allergies Penicillins and Zithromax [azithromycin]  Family History  Problem Relation Age of Onset  . Healthy Mother   . Healthy Father   . Diabetes Maternal Grandmother   . Hypertension Paternal Grandmother     Social  History Social History   Tobacco Use  . Smoking status: Former Smoker    Packs/day: 0.50    Types: Cigarettes    Quit date: 02/10/2020    Years since quitting: 0.4  . Smokeless tobacco: Never Used  Vaping Use  . Vaping Use: Every day  . Start date: 02/10/2020  . Substances: Nicotine, Flavoring  Substance Use Topics  . Alcohol use: Not Currently    Comment: last drink was 02/2020  . Drug use: Yes    Frequency: 7.0 times per week    Types: Marijuana    Review of Systems  Constitutional: No fever/chills Eyes: No visual changes. ENT: No sore throat. Respiratory: Denies cough Cardiovascular: Denies chest pain Gastrointestinal: Denies abdominal pain Genitourinary: Negative for dysuria.  Positive testicular pain Musculoskeletal: Negative for back pain. Skin: Negative for rash. Psychiatric: no mood changes,     ____________________________________________   PHYSICAL EXAM:  VITAL SIGNS: ED Triage Vitals  Enc Vitals Group     BP 08/06/20 0902 (!) 146/96     Pulse Rate 08/06/20 0902 81     Resp 08/06/20 0902 20     Temp 08/06/20 0902 98.5 F (36.9 C)     Temp Source 08/06/20 0902 Oral     SpO2 08/06/20 0902 99 %     Weight 08/06/20 0851 170 lb (77.1 kg)     Height 08/06/20 0851 6' (1.829 m)     Head  Circumference --      Peak Flow --      Pain Score 08/06/20 0851 10     Pain Loc --      Pain Edu? --      Excl. in GC? --     Constitutional: Alert and oriented. Well appearing and in no acute distress. Eyes: Conjunctivae are normal.  Head: Atraumatic. Nose: No congestion/rhinnorhea. Mouth/Throat: Mucous membranes are moist.   Neck:  supple no lymphadenopathy noted Cardiovascular: Normal rate, regular rhythm. Heart sounds are normal Respiratory: Normal respiratory effort.  No retractions, lungs c t a  Abd: soft nontender bs normal all 4 quad GU: Right testicle is very large and swollen with large hydrocele, tender to palpation, no herpetic lesions or drainage  noted Musculoskeletal: FROM all extremities, warm and well perfused Neurologic:  Normal speech and language.  Skin:  Skin is warm, dry and intact. No rash noted. Psychiatric: Mood and affect are normal. Speech and behavior are normal.  ____________________________________________   LABS (all labs ordered are listed, but only abnormal results are displayed)  Labs Reviewed  URINALYSIS, COMPLETE (UACMP) WITH MICROSCOPIC - Abnormal; Notable for the following components:      Result Value   Color, Urine STRAW (*)    APPearance CLEAR (*)    All other components within normal limits  COMPREHENSIVE METABOLIC PANEL - Abnormal; Notable for the following components:   Total Protein 8.6 (*)    All other components within normal limits  CBC WITH DIFFERENTIAL/PLATELET - Abnormal; Notable for the following components:   Platelets 467 (*)    All other components within normal limits  CHLAMYDIA/NGC RT PCR ([facility] ONLY)   ____________________________________________   ____________________________________________  RADIOLOGY  Ultrasound scrotum  ____________________________________________   PROCEDURES  Procedure(s) performed: No  Procedures    ____________________________________________   INITIAL IMPRESSION / ASSESSMENT AND PLAN / ED COURSE  Pertinent labs & imaging results that were available during my care of the patient were reviewed by me and considered in my medical decision making (see chart for details).   Patient is 35 year old male presents with scrotal pain.  See HPI.  Physical exam shows patient per stable  DDx: Hydrocele, tumor, epididymitis, torsion, STD  Patient's labs are reassuring, CBC, metabolic panel, urinalysis and GC/committee are all negative for any acute abnormality  Ultrasound from last week reviewed which showed a hydrocele and epididymitis. Ultrasound from this week was reviewed showing a larger more complex hydrocele and continued  epididymitis  Consult to urology, Dr. Alvester Morin, recommends treatment for 14 days  Did explain the findings to the patient.  He was given Rocephin 1 g IV, doxycycline 100 mg twice daily for 14 days.   Patient was given his prescription for antibiotics along with pain medication.  He is to follow-up with urology.  Return if worsening.  Discharged in stable condition.  Edith Lord Henigan was evaluated in Emergency Department on 08/06/2020 for the symptoms described in the history of present illness. He was evaluated in the context of the global COVID-19 pandemic, which necessitated consideration that the patient might be at risk for infection with the SARS-CoV-2 virus that causes COVID-19. Institutional protocols and algorithms that pertain to the evaluation of patients at risk for COVID-19 are in a state of rapid change based on information released by regulatory bodies including the CDC and federal and state organizations. These policies and algorithms were followed during the patient's care in the ED.    As part of my medical decision  making, I reviewed the following data within the electronic MEDICAL RECORD NUMBER Nursing notes reviewed and incorporated, Labs reviewed , Old chart reviewed, Radiograph reviewed , A consult was requested and obtained from this/these consultant(s) Urology, Notes from prior ED visits and [place] Controlled Substance Database  ____________________________________________   FINAL CLINICAL IMPRESSION(S) / ED DIAGNOSES  Final diagnoses:  Epididymitis  Hydrocele in adult      NEW MEDICATIONS STARTED DURING THIS VISIT:  Discharge Medication List as of 08/06/2020 11:35 AM    START taking these medications   Details  doxycycline (ADOXA) 100 MG tablet Take 1 tablet (100 mg total) by mouth 2 (two) times daily for 14 days., Starting Sat 08/06/2020, Until Sat 08/20/2020, Normal    HYDROcodone-acetaminophen (NORCO/VICODIN) 5-325 MG tablet Take 1 tablet by mouth every 6 (six) hours as  needed for moderate pain., Starting Sat 08/06/2020, Normal    ibuprofen (ADVIL) 800 MG tablet Take 1 tablet (800 mg total) by mouth every 8 (eight) hours as needed., Starting Sat 08/06/2020, Normal         Note:  This document was prepared using Dragon voice recognition software and may include unintentional dictation errors.    Faythe Ghee, PA-C 08/06/20 1404    Jene Every, MD 08/06/20 1409

## 2020-08-06 NOTE — Discharge Instructions (Signed)
Take medications as prescribed.  Follow-up with your regular doctor if not improving in 3 to 4 days.  Return to ER if worsening.  Wear either tight underwear or a jockstrap with left the testicles to prevent pain.  Can apply an ice pack.

## 2020-08-06 NOTE — ED Triage Notes (Signed)
Pt reports pain to his right testicle for a little over a week. Pt reports was seen here over a week ago because he was having pain in his right flank that radiated around to his groin but now the pain is only in his right testicle. Pt denies difficulty urinating or blood in the urine

## 2020-08-06 NOTE — ED Notes (Signed)
X antibiotic and pain meds sent to pharmacy, inof provided follow up urology info provided all questions answered

## 2021-09-26 ENCOUNTER — Emergency Department
Admission: EM | Admit: 2021-09-26 | Discharge: 2021-09-26 | Disposition: A | Discharge status: Home / Self Care | Payer: Self-pay | Attending: Emergency Medicine | Admitting: Emergency Medicine

## 2021-09-26 ENCOUNTER — Other Ambulatory Visit: Payer: Self-pay

## 2021-09-26 DIAGNOSIS — F17200 Nicotine dependence, unspecified, uncomplicated: Secondary | ICD-10-CM | POA: Insufficient documentation

## 2021-09-26 DIAGNOSIS — L0291 Cutaneous abscess, unspecified: Secondary | ICD-10-CM

## 2021-09-26 DIAGNOSIS — L0231 Cutaneous abscess of buttock: Secondary | ICD-10-CM | POA: Insufficient documentation

## 2021-09-26 MED ORDER — LIDOCAINE HCL (PF) 1 % IJ SOLN
5.0000 mL | Freq: Once | INTRAMUSCULAR | Status: AC
  Administered 2021-09-26: 5 mL
  Filled 2021-09-26: qty 5

## 2021-09-26 MED ORDER — LIDOCAINE-EPINEPHRINE-TETRACAINE (LET) TOPICAL GEL: 3.0000 mL | Freq: Once | TOPICAL | Status: DC

## 2021-09-26 MED ORDER — OXYCODONE-ACETAMINOPHEN 5-325 MG PO TABS
1.0000 | ORAL_TABLET | Freq: Once | ORAL | Status: AC
  Administered 2021-09-26: 1 via ORAL
  Filled 2021-09-26: qty 1

## 2021-09-26 MED ORDER — LIDOCAINE-PRILOCAINE 2.5-2.5 % EX CREA
TOPICAL_CREAM | Freq: Once | CUTANEOUS | Status: AC
  Administered 2021-09-26: 1 via TOPICAL
  Filled 2021-09-26: qty 5

## 2021-09-26 MED ORDER — OXYCODONE-ACETAMINOPHEN 5-325 MG PO TABS: 1.0000 | ORAL_TABLET | Freq: Four times a day (QID) | ORAL | 0 refills | Status: DC | PRN

## 2021-09-26 MED ORDER — CLINDAMYCIN HCL 300 MG PO CAPS: 300.0000 mg | ORAL_CAPSULE | Freq: Three times a day (TID) | ORAL | 0 refills | Status: AC

## 2021-09-26 NOTE — ED Provider Notes (Signed)
[facility] Provider Note    Event Date/Time   First MD Initiated Contact with Patient 09/26/21 1257     (approximate)   History   Abscess   HPI  Jeffery Sutton is a 36 y.o. male   presents to the ED with complaint of boil to the right buttocks for the last 5 days.  Patient states he has had abscesses in the past.  Denies fever or chills.  Patient has a history of cholecystitis, substance abuse and is a smoker.      Physical Exam   Triage Vital Signs: ED Triage Vitals  Enc Vitals Group     BP 09/26/21 1209 137/85     Pulse Rate 09/26/21 1209 95     Resp 09/26/21 1209 18     Temp 09/26/21 1209 98.1 F (36.7 C)     Temp Source 09/26/21 1209 Oral     SpO2 09/26/21 1209 99 %     Weight 09/26/21 1207 175 lb (79.4 kg)     Height 09/26/21 1207 6' (1.829 m)     Head Circumference --      Peak Flow --      Pain Score 09/26/21 1207 8     Pain Loc --      Pain Edu? --      Excl. in GC? --     Most recent vital signs: Vitals:   09/26/21 1209  BP: 137/85  Pulse: 95  Resp: 18  Temp: 98.1 F (36.7 C)  SpO2: 99%     General: Awake, no distress.  CV:  Good peripheral perfusion.  Resp:  Normal effort.  Abd:  No distention.  Other:  Medial aspect right buttocks with 2 cm firm nodule with a central area that appears to be ready to drain spontaneously.  Area is moderately tender to palpation.  No erythema surrounding to suggest cellulitis.   ED Results / Procedures / Treatments   Labs (all labs ordered are listed, but only abnormal results are displayed) Labs Reviewed - No data to display    PROCEDURES:  Critical Care performed:   Marland KitchenMarland KitchenIncision and Drainage  Date/Time: 09/26/2021 1:48 PM  Performed by: Tommi Rumps, PA-C Authorized by: Tommi Rumps, PA-C   Consent:    Consent obtained:  Verbal   Consent given by:  Patient   Risks discussed:  Bleeding, infection, incomplete drainage and pain Universal protocol:    Patient  identity confirmed:  Verbally with patient Location:    Type:  Abscess   Location:  Lower extremity   Lower extremity location:  Buttock   Buttock location:  R buttock Pre-procedure details:    Skin preparation:  Antiseptic wash Sedation:    Sedation type:  None Anesthesia:    Anesthesia method:  Local infiltration and topical application   Topical anesthetic:  EMLA cream   Local anesthetic:  Lidocaine 1% w/o epi Procedure type:    Complexity:  Simple Procedure details:    Incision types:  Stab incision   Incision depth:  Dermal   Drainage:  Purulent   Drainage amount:  Moderate   Wound treatment:  Drain placed   Packing materials:  1/2 in iodoform gauze Post-procedure details:    Procedure completion:  Tolerated    MEDICATIONS ORDERED IN ED: Medications  lidocaine (PF) (XYLOCAINE) 1 % injection 5 mL (5 mLs Infiltration Given 09/26/21 1414)  oxyCODONE-acetaminophen (PERCOCET/ROXICET) 5-325 MG per tablet 1 tablet (1 tablet Oral Given 09/26/21 1415)  lidocaine-prilocaine (EMLA) cream (1 Application Topical Given 09/26/21 1414)     IMPRESSION / MDM / ASSESSMENT AND PLAN / ED COURSE  I reviewed the triage vital signs and the nursing notes.   Differential diagnosis includes, but is not limited to, abscess right buttocks, pilonidal cyst, cellulitis.  36 year old male presents to the ED with complaint of abscess to his right buttocks for the last 5 days.  Patient has not had similar in the past.  He has been standing in a warm shower hoping that it would open up.  Emla cream was applied and after approximately 15 to 20 minutes lidocaine was used.  Patient tolerated procedure well.  He was instructed to begin taking the antibiotic until completely finished and also a prescription for pain medication was sent to the pharmacy.  He is instructed to either take the packing out, follow-up with his PCP, urgent care, or return to the emergency department for this.  He is also given strict  return precautions should he develop any fever or chills he is to return to the emergency department for reevaluation.      Patient's presentation is most consistent with acute illness / injury with system symptoms.  FINAL CLINICAL IMPRESSION(S) / ED DIAGNOSES   Final diagnoses:  Abscess     Rx / DC Orders   ED Discharge Orders          Ordered    clindamycin (CLEOCIN) 300 MG capsule  3 times daily        09/26/21 1504    oxyCODONE-acetaminophen (PERCOCET) 5-325 MG tablet  Every 6 hours PRN        09/26/21 1504             Note:  This document was prepared using Dragon voice recognition software and may include unintentional dictation errors.   Tommi Rumps, PA-C 09/26/21 1510    Gilles Chiquito, MD 09/26/21 [phone]

## 2021-09-26 NOTE — ED Triage Notes (Signed)
Pt to ED for abscess/boil to R buttock since Thursday (5d). Hx of same. Is painful. States pain feels deeper than other times "like it's going into my muscle". States nothing helps, would like to have area drained if possible.

## 2021-09-26 NOTE — Discharge Instructions (Signed)
Follow-up with your primary care provider if any continued problems.  Return to the emergency department if any severe worsening of your symptoms such as fever chills or worsening of the abscess area.  At some point you may want to see a surgeon and Dr. Claudine Mouton is the surgeon on-call today.  His contact information is listed on your discharge papers.  Prescriptions were sent to your pharmacy 1 is a pain medication and you should not drive or operate machinery while taking it.  Clindamycin is the antibiotic.  Use warm moist compresses to the area frequently to help with drainage.  Your drain will need to be removed in 2 days if it is not fallen out on its own.  You may take this out yourself, urgent care or return to the emergency department.

## 2021-12-27 ENCOUNTER — Emergency Department
Admission: EM | Admit: 2021-12-27 | Discharge: 2021-12-27 | Disposition: A | Discharge status: Home / Self Care | Payer: Self-pay | Attending: Emergency Medicine | Admitting: Emergency Medicine

## 2021-12-27 ENCOUNTER — Other Ambulatory Visit: Payer: Self-pay

## 2021-12-27 DIAGNOSIS — R251 Tremor, unspecified: Secondary | ICD-10-CM

## 2021-12-27 NOTE — Discharge Instructions (Signed)
Call make an appoint with your primary care provider for further evaluation of the tremors that you are describing.

## 2021-12-27 NOTE — ED Notes (Signed)
Pt and RN signed AMA form. Pt declines bloodwork and is leaving AMA, will f/u with PCP.

## 2021-12-27 NOTE — ED Triage Notes (Signed)
Pt in with co generalized tremors states for 2 hrs. Hx of the same and was told it was stress related.

## 2021-12-27 NOTE — ED Notes (Addendum)
Patient refused bloodwork 

## 2021-12-27 NOTE — ED Provider Notes (Signed)
[facility] Provider Note    Event Date/Time   First MD Initiated Contact with Patient 12/27/21 [phone]     (approximate)   History   Tremors   HPI  Jeffery Sutton is a 36 y.o. male   presents to the ED with complaint of generalized "tremors" intermittently for the last 3 days.  Patient has history of same and was being seen at [facility] where his Dilantin was discontinued because neurologist stated he did not have epilepsy.  Patient states that occasionally tremors will last for an hour and sometimes for just a few minutes.      Physical Exam   Triage Vital Signs: ED Triage Vitals  Enc Vitals Group     BP 12/27/21 0716 113/70     Pulse Rate 12/27/21 0716 92     Resp 12/27/21 0716 17     Temp 12/27/21 0716 97.7 F (36.5 C)     Temp Source 12/27/21 0716 Oral     SpO2 12/27/21 0716 100 %     Weight 12/27/21 0714 170 lb (77.1 kg)     Height 12/27/21 0714 6' (1.829 m)     Head Circumference --      Peak Flow --      Pain Score 12/27/21 0714 0     Pain Loc --      Pain Edu? --      Excl. in GC? --     Most recent vital signs: Vitals:   12/27/21 0716 12/27/21 0800  BP: 113/70 113/87  Pulse: 92 98  Resp: 17   Temp: 97.7 F (36.5 C)   SpO2: 100% 100%     General: Awake, no distress.  CV:  Good peripheral perfusion.  Heart regular rate rhythm. Resp:  Normal effort.  Lungs are clear bilaterally. Abd:  No distention.  Other:  No active tremors noted at this time.  Patient is ambulatory without any assistance.   ED Results / Procedures / Treatments   Labs (all labs ordered are listed, but only abnormal results are displayed) Labs Reviewed - No data to display     PROCEDURES:  Critical Care performed:   Procedures   MEDICATIONS ORDERED IN ED: Medications - No data to display   IMPRESSION / MDM / ASSESSMENT AND PLAN / ED COURSE  I reviewed the triage vital signs and the nursing notes.   Differential diagnosis includes, but is  not limited to, familial tremors, alcohol withdrawal tremor, hypokalemia, drug-induced tremor.  36 year old male presents to the ED with complaint of intermittent tremors that can last anywhere from 2 hours to just a few minutes.  Patient states that he was seen at [facility] and was taken off his Dilantin.  According to medical records he was last seen for "seizures" was 2016 at [facility].  Patient denies recent use of alcohol and does not associate these with discontinuation of alcohol.  He states that he had one that lasted a few minutes 30 minutes after starting his job this morning.  Patient refused lab work as he did not disclose before getting hired that he had hepatitis C.  Patient has a history of substance abuse.  I discussed that we would not be specifically testing him for hepatitis C with the lab work that was ordered today however he still continued to refuse lab work.  He is therefore signing out AMA.  I strongly encouraged him to follow-up with his PCP or [facility] for reevaluation.  Patient's presentation is most consistent with acute complicated illness / injury requiring diagnostic workup.  FINAL CLINICAL IMPRESSION(S) / ED DIAGNOSES   Final diagnoses:  Occasional tremors     Rx / DC Orders   ED Discharge Orders     None        Note:  This document was prepared using Dragon voice recognition software and may include unintentional dictation errors.   Johnn Hai, PA-C 12/27/21 4132    Lavonia Drafts, MD 12/27/21 [phone]

## 2022-05-24 ENCOUNTER — Other Ambulatory Visit: Payer: Self-pay

## 2022-05-24 ENCOUNTER — Emergency Department: Payer: Self-pay

## 2022-05-24 ENCOUNTER — Encounter: Payer: Self-pay | Admitting: Emergency Medicine

## 2022-05-24 ENCOUNTER — Emergency Department
Admission: EM | Admit: 2022-05-24 | Discharge: 2022-05-24 | Disposition: A | Discharge status: Home / Self Care | Payer: Self-pay | Attending: Emergency Medicine | Admitting: Emergency Medicine

## 2022-05-24 DIAGNOSIS — N451 Epididymitis: Secondary | ICD-10-CM | POA: Insufficient documentation

## 2022-05-24 LAB — URINALYSIS, ROUTINE W REFLEX MICROSCOPIC
Bacteria, UA: NONE SEEN
Bilirubin Urine: NEGATIVE
Glucose, UA: NEGATIVE mg/dL
Hgb urine dipstick: NEGATIVE
Ketones, ur: NEGATIVE mg/dL
Nitrite: NEGATIVE
Protein, ur: NEGATIVE mg/dL
Specific Gravity, Urine: 1.024 (ref 1.005–1.030)
Squamous Epithelial / HPF: NONE SEEN /HPF (ref 0–5)
pH: 6 (ref 5.0–8.0)

## 2022-05-24 LAB — CBC WITH DIFFERENTIAL/PLATELET
Abs Immature Granulocytes: 0.02 10*3/uL (ref 0.00–0.07)
Basophils Absolute: 0 10*3/uL (ref 0.0–0.1)
Basophils Relative: 0 %
Eosinophils Absolute: 0.1 10*3/uL (ref 0.0–0.5)
Eosinophils Relative: 1 %
HCT: 47.2 % (ref 39.0–52.0)
Hemoglobin: 15.1 g/dL (ref 13.0–17.0)
Immature Granulocytes: 0 %
Lymphocytes Relative: 27 %
Lymphs Abs: 2.2 10*3/uL (ref 0.7–4.0)
MCH: 27.2 pg (ref 26.0–34.0)
MCHC: 32 g/dL (ref 30.0–36.0)
MCV: 85 fL (ref 80.0–100.0)
Monocytes Absolute: 0.8 10*3/uL (ref 0.1–1.0)
Monocytes Relative: 9 %
Neutro Abs: 5.2 10*3/uL (ref 1.7–7.7)
Neutrophils Relative %: 63 %
Platelets: 385 10*3/uL (ref 150–400)
RBC: 5.55 MIL/uL (ref 4.22–5.81)
RDW: 14.1 % (ref 11.5–15.5)
WBC: 8.3 10*3/uL (ref 4.0–10.5)
nRBC: 0 % (ref 0.0–0.2)

## 2022-05-24 LAB — COMPREHENSIVE METABOLIC PANEL
ALT: 34 U/L (ref 0–44)
AST: 29 U/L (ref 15–41)
Albumin: 3.9 g/dL (ref 3.5–5.0)
Alkaline Phosphatase: 74 U/L (ref 38–126)
Anion gap: 5 (ref 5–15)
BUN: 9 mg/dL (ref 6–20)
CO2: 26 mmol/L (ref 22–32)
Calcium: 9 mg/dL (ref 8.9–10.3)
Chloride: 105 mmol/L (ref 98–111)
Creatinine, Ser: 0.89 mg/dL (ref 0.61–1.24)
GFR, Estimated: >60 mL/min (ref >60)
Glucose, Bld: 98 mg/dL (ref 70–99)
Potassium: 4.1 mmol/L (ref 3.5–5.1)
Sodium: 136 mmol/L (ref 135–145)
Total Bilirubin: 0.6 mg/dL (ref 0.3–1.2)
Total Protein: 8 g/dL (ref 6.5–8.1)

## 2022-05-24 LAB — HIV ANTIBODY (ROUTINE TESTING W REFLEX): HIV Screen 4th Generation wRfx: NONREACTIVE

## 2022-05-24 LAB — CHLAMYDIA/NGC RT PCR (ARMC ONLY)
Chlamydia Tr: NOT DETECTED
N gonorrhoeae: NOT DETECTED

## 2022-05-24 MED ORDER — OXYCODONE HCL 5 MG PO TABS
5.0000 mg | ORAL_TABLET | Freq: Once | ORAL | Status: AC
  Administered 2022-05-24: 5 mg via ORAL
  Filled 2022-05-24: qty 1

## 2022-05-24 MED ORDER — OXYCODONE HCL 5 MG PO TABS: 5.0000 mg | ORAL_TABLET | Freq: Four times a day (QID) | ORAL | 0 refills | Status: DC | PRN

## 2022-05-24 MED ORDER — OXYCODONE HCL 5 MG PO TABS: 5.0000 mg | ORAL_TABLET | Freq: Four times a day (QID) | ORAL | 0 refills | Status: AC | PRN

## 2022-05-24 MED ORDER — LEVOFLOXACIN 500 MG PO TABS: 500.0000 mg | ORAL_TABLET | Freq: Every day | ORAL | 0 refills | Status: AC

## 2022-05-24 MED ORDER — CEFTRIAXONE SODIUM 1 G IJ SOLR
500.0000 mg | Freq: Once | INTRAMUSCULAR | Status: AC
  Administered 2022-05-24: 500 mg via INTRAMUSCULAR
  Filled 2022-05-24: qty 5
  Filled 2022-05-24: qty 10

## 2022-05-24 MED ORDER — LEVOFLOXACIN 500 MG PO TABS: 500.0000 mg | ORAL_TABLET | Freq: Every day | ORAL | 0 refills | Status: DC

## 2022-05-24 NOTE — ED Provider Notes (Signed)
[facility] Provider Note    Event Date/Time   First MD Initiated Contact with Patient 05/24/22 0700     (approximate)   History   No chief complaint on file.   HPI  Jeffery Sutton is a 37 y.o. male who has history of epididymitis he comes in with concerns for right testicle pain.  Patient reports having right testicle pain over the past week.  He reports trying to wear tight underwear to help support it became continue to have pain.  He reports only being sexually active with his partner who is a male.  He reports being with him for the past 3 years.  He denies any new partners.  He denies any known fevers.  He reports increasing pain to the right testicle.  He states that he had reported the concern for an abscess to the triage nurse given he had thought that is what his discharge papers had said previously but he denies seeing any abscess or any drainage or any discoloration of his testicle.  He reports having the epididymitis 4-5 times since puberty and this feels similar to the prior episodes.  Physical Exam   Triage Vital Signs: ED Triage Vitals [05/24/22 0636]  Enc Vitals Group     BP (!) 134/90     Pulse Rate 77     Resp 18     Temp 98 F (36.7 C)     Temp src      SpO2 100 %     Weight 245 lb (111.1 kg)     Height '5\' 10"'$  (1.778 m)     Head Circumference      Peak Flow      Pain Score 10     Pain Loc      Pain Edu?      Excl. in [place]?     Most recent vital signs: Vitals:   05/24/22 0636  BP: (!) 134/90  Pulse: 77  Resp: 18  Temp: 98 F (36.7 C)  SpO2: 100%     General: Awake, no distress.  CV:  Good peripheral perfusion.  Resp:  Normal effort.  Abd:  No distention.  Other:  No obvious lymphadenopathy noted.  No ulceration noted over the penis.  No active drainage.  He does have tenderness over the right testicle in epididymitis area and swelling noted.   ED Results / Procedures / Treatments   Labs (all labs ordered are  listed, but only abnormal results are displayed) Labs Reviewed  URINALYSIS, ROUTINE W REFLEX MICROSCOPIC - Abnormal; Notable for the following components:      Result Value   Color, Urine YELLOW (*)    APPearance CLEAR (*)    Leukocytes,Ua TRACE (*)    All other components within normal limits  URINE CULTURE  CHLAMYDIA/NGC RT PCR ([facility] ONLY)            CBC WITH DIFFERENTIAL/PLATELET  COMPREHENSIVE METABOLIC PANEL  HIV ANTIBODY (ROUTINE TESTING W REFLEX)  RPR       RADIOLOGY I have reviewed the ultrasound personally interpreted and no evidence of any torsion   PROCEDURES:  Critical Care performed: No  Procedures   MEDICATIONS ORDERED IN ED: Medications  cefTRIAXone (ROCEPHIN) injection 500 mg (has no administration in time range)  oxyCODONE (Oxy IR/ROXICODONE) immediate release tablet 5 mg (5 mg Oral Given 05/24/22 0723)     IMPRESSION / MDM / ASSESSMENT AND PLAN / ED COURSE  I reviewed the triage vital  signs and the nursing notes.   Patient's presentation is most consistent with acute presentation with potential threat to life or bodily function.   Differential includes UTI, STD, epididymitis, torsion seems less likely. No signs of nec fasc on exam.  Has been tested previously with negative HIV and denies any new partners however he does consent to syphilis and HIV testing given recurrent cases of this..  Patient given a dose of oxycodone to help with pain  CBC is reassuring.  CMP is reassuring.  Urine with 21-50 WBCs urine culture is pending.  Given patient could have STI or enteric organisms will treat with levofloxacin for 10 days.  Patient given a dose of ceftriaxone as well.  I did call lab and his HIV and syphilis screening will not be back till tomorrow.  I discussed this with patient.  He understands that these test are still pending including his gonorrhea and Chlamydia test.  He states that he does work here and he can follow them up in his MyChart but have  also discussed with him leaving his phone on him so that we can call him if these results are positive he expressed understanding understands that these are in process at time of discharge  I did discuss with patient's short course of oxycodone but he understands not to drive or work while on this    FINAL CLINICAL IMPRESSION(S) / ED DIAGNOSES   Final diagnoses:  Epididymitis     Rx / DC Orders   ED Discharge Orders          Ordered    levofloxacin (LEVAQUIN) 500 MG tablet  Daily        05/24/22 0917    oxyCODONE (ROXICODONE) 5 MG immediate release tablet  Every 6 hours PRN        05/24/22 0919             Note:  This document was prepared using Dragon voice recognition software and may include unintentional dictation errors.   Vanessa , MD 05/24/22 [phone]

## 2022-05-24 NOTE — ED Notes (Signed)
[facility]ery [facility] called, and they will not fill his prescriptions as he is not a patient there.  I called him and informed him that they would be sent to [facility]--made that his preferred pharmacy.

## 2022-05-24 NOTE — Discharge Instructions (Addendum)
Your STD testing is in process and will not be back till tomorrow.  Please confirm on MyChart your negative results or leave your phone on you in case they are positive we typically will call you.  We are treating you through for epididymitis.  You can take Tylenol 1 g every 8 hours and ibuprofen 600 every 6-8 hours with food to help with pain.  We will give you a short amount of pain medication.  No driving or working while on this. Call urology to make follow up.  Take oxycodone as prescribed. Do not drink alcohol, drive or participate in any other potentially dangerous activities while taking this medication as it may make you sleepy. Do not take this medication with any other sedating medications, either prescription or over-the-counter. If you were prescribed Percocet or Vicodin, do not take these with acetaminophen (Tylenol) as it is already contained within these medications.  This medication is an opiate (or narcotic) pain medication and can be habit forming. Use it as little as possible to achieve adequate pain control. Do not use or use it with extreme caution if you have a history of opiate abuse or dependence. If you are on a pain contract with your primary care doctor or a pain specialist, be sure to let them know you were prescribed this medication today from the Emergency Department. This medication is intended for your use only - do not give any to anyone else and keep it in a secure place where nobody else, especially children, have access to it.

## 2022-05-24 NOTE — ED Triage Notes (Signed)
Patient ambulatory to triage with steady gait, without difficulty or distress noted; pt reports abscess to rt side scrotum x 4 days

## 2022-05-25 LAB — URINE CULTURE: Culture: NO GROWTH

## 2022-05-25 LAB — RPR: RPR Ser Ql: NONREACTIVE

## 2022-06-19 ENCOUNTER — Other Ambulatory Visit: Payer: Self-pay

## 2022-06-19 ENCOUNTER — Emergency Department
Admission: EM | Admit: 2022-06-19 | Discharge: 2022-06-19 | Disposition: A | Discharge status: Home / Self Care | Payer: Self-pay | Attending: Student in an Organized Health Care Education/Training Program | Admitting: Student in an Organized Health Care Education/Training Program

## 2022-06-19 DIAGNOSIS — R569 Unspecified convulsions: Secondary | ICD-10-CM | POA: Insufficient documentation

## 2022-06-19 MED ORDER — LORAZEPAM 1 MG PO TABS
1.0000 mg | ORAL_TABLET | Freq: Once | ORAL | Status: AC
  Administered 2022-06-19: 1 mg via ORAL
  Filled 2022-06-19: qty 1

## 2022-06-19 NOTE — Progress Notes (Signed)
  Chaplain On-Call responded to Rapid Response notification at 0830 for an event in the hospital kitchen.  Chaplain went to the kitchen and learned from Manager Onalee Hua that the Rapid Response was called due to an employee experiencing seizures.  The employee, Jeffery Sutton, was taken to the Emergency Department, room 17.  The medical team was providing care for the patient, who was unavailable for the Chaplain to visit before he was discharged.  Chaplain Evelena Peat M.Div., [facility]

## 2022-06-19 NOTE — ED Triage Notes (Signed)
Pt currently trembling all over.

## 2022-06-19 NOTE — ED Provider Notes (Signed)
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  Patient has had extensive neurowork-up as an outpatient reportedly found to be having episodes of vasovagal episodes for these "blacking out spells ".  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  He does endorse increased amount of stress at home and at work.  States that stress makes him have these episodes more frequently.  He does not want any blood work done.  States that he is been on Dilantin prescribed by Phineas Real has not been taking as much as usual because it makes him very sleepy.  Did not bite his tongue.  No loss of bladder control.  Denies any headache.     Physical Exam   Triage Vital Signs: ED Triage Vitals  Enc Vitals Group     BP 06/19/22 0848 (!) 126/90     Pulse Rate 06/19/22 0848 96     Resp 06/19/22 0848 (!) 22     Temp --      Temp src --      SpO2 06/19/22 0848 100 %     Weight 06/19/22 0847 170 lb (77.1 kg)     Height 06/19/22 0847 5\' 10"  (1.778 m)     Head Circumference --      Peak Flow --      Pain Score 06/19/22 0847 0     Pain Loc --      Pain Edu? --      Excl. in GC? --     Most recent vital signs: Vitals:   06/19/22 0848 06/19/22 0857  BP: (!) 126/90   Pulse: 96   Resp: (!) 22   Temp:  98 F (36.7 C)  SpO2: 100%      Constitutional: Alert  Eyes: Conjunctivae are normal.  Head: Atraumatic. Nose: No congestion/rhinnorhea. Mouth/Throat: Mucous membranes are moist.   Neck: Painless ROM.  Cardiovascular:   Good peripheral circulation. Respiratory: Normal respiratory effort.  No retractions.  Gastrointestinal: Soft and nontender.  Musculoskeletal:  no deformity Neurologic:   MAE spontaneously. No gross focal neurologic deficits are appreciated.  Skin:  Skin is warm, dry and intact. No rash noted. Psychiatric: Mood and affect are anxious. Speech and behavior are normal.    ED Results / Procedures / Treatments   Labs (all labs ordered are listed, but only abnormal results are displayed) Labs Reviewed - No data to display   EKG  ED ECG REPORT I, Willy Eddy, the attending physician, personally viewed and interpreted this ECG.   Date: 06/19/2022  EKG Time: 8:52  Rate: 90  Rhythm: sinus  Axis: normal  Intervals:normal  ST&T Change: no stemi, no depressions    RADIOLOGY    PROCEDURES:  Critical Care performed: No  Procedures   MEDICATIONS ORDERED IN ED: Medications  LORazepam (ATIVAN) tablet 1 mg (1 mg Oral Given 06/19/22 0904)     IMPRESSION / MDM / ASSESSMENT AND PLAN / ED COURSE  I reviewed the triage vital signs and the nursing notes.  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  Patient very anxious will give Ativan.  Have recommended blood work patient declining any blood work to be done.  Based on his presentation I do have a high suspicion this was a pseudo form nonepileptic seizure do not feel that neuroimaging clinically indicated.  Will observe.   Clinical Course as of 06/19/22 0945  Tue Jun 19, 2022  0945 Patient reassessed very well-appearing more calm and relaxed.  Family at bedside states that he is at baseline and feel comfortable taking patient home at this time.  Discussed signs and symptoms for which patient should return to the ER. [PR]    Clinical Course User Index [PR] Willy Eddy, MD     FINAL  CLINICAL IMPRESSION(S) / ED DIAGNOSES   Final diagnoses:  Seizure-like activity     Rx / DC Orders   ED Discharge Orders     None        Note:  This document was prepared using Dragon voice recognition software and may include unintentional dictation errors.    Willy Eddy, MD 06/19/22 [phone]

## 2022-06-19 NOTE — ED Notes (Signed)
Pt no longer trembling. States he feels better and more relaxed. Ambulates without assistance.

## 2022-06-19 NOTE — ED Notes (Signed)
Brother at bedside 

## 2022-06-19 NOTE — ED Notes (Signed)
Pt states he has had increased work and home stress. Denies desire to be dead but states he has had thoughts of self-harm. Hesitant to see a counselor but states he would like a referral.

## 2022-06-19 NOTE — ED Triage Notes (Addendum)
Pt in cafeteria and had witnessed seizure. States when he has one he "trembles all over". States he did not take his medication this morning because he was running late. Had a seizure yesterday. Is refusing IV and blood draw at this point

## 2022-08-15 ENCOUNTER — Ambulatory Visit: Payer: Self-pay

## 2022-12-11 ENCOUNTER — Ambulatory Visit: Payer: Self-pay

## 2022-12-20 ENCOUNTER — Telehealth: Payer: Self-pay | Admitting: Family Medicine

## 2022-12-20 NOTE — Telephone Encounter (Signed)
LM for pt to return my call

## 2022-12-20 NOTE — Telephone Encounter (Signed)
Please give me a call back I need to be seen for an sti asap I have discharge

## 2023-05-13 ENCOUNTER — Ambulatory Visit: Payer: Self-pay | Admitting: Nurse Practitioner

## 2023-05-13 DIAGNOSIS — Z113 Encounter for screening for infections with a predominantly sexual mode of transmission: Secondary | ICD-10-CM

## 2023-05-13 NOTE — Progress Notes (Unsigned)
Pt is here for std screening, condoms given.  Gaspar Garbe, RN

## 2023-05-14 ENCOUNTER — Encounter: Payer: Self-pay | Admitting: Nurse Practitioner

## 2023-05-14 DIAGNOSIS — R768 Other specified abnormal immunological findings in serum: Secondary | ICD-10-CM | POA: Insufficient documentation

## 2023-05-14 NOTE — Progress Notes (Signed)
 [facility] Department STI clinic [address], [address] [place] [address] Main phone: [phone]  STI screening visit  Subjective:  Jeffery Sutton is a 38 y.o. male being seen today for an STI screening visit. The patient reports they do not have symptoms.    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 male who presents to the clinic today for asymptomatic STI testing as a known contact to HIV. Patient reports his same sex partner (X 8 years) tested positive for HIV 3 Romo ago. Patient has been tested for HIV in the past, most recently 1 year ago, and was negative. 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  Patient indicates last sexual encounter was 02/27/2023, practices giving and receiving oral and anal sex and does not use condoms. Patient also indicates a positive gonorrhea test 8 years ago.  Patient denies viral-like illness symptoms.    STI screening history: Last HIV test per patient/review of record was No results found for: "HMHIVSCREEN"  Lab Results  Component Value Date   HIV Non Reactive 05/24/2022    Last HEPC test per patient/review of record was reactive/detected/1,090/3.040 on 06/22/20.  Last HEPB test per patient/review of record was not found.  Fertility: Does the patient or their partner desires a pregnancy in the next year? No  Screening for MPX risk: Does the patient have an unexplained rash? No Is the patient MSM? Yes Does the patient endorse multiple sex partners or anonymous sex partners? No Did the patient have close or sexual contact with a person diagnosed with MPX? No Has the patient traveled outside the [place] where MPX is  endemic? No Is there a high clinical suspicion for MPX-- evidenced by one of the following No  -Unlikely to be chickenpox  -Lymphadenopathy  -Rash that present in same phase of evolution on any given body part   See flowsheet for further details and programmatic requirements.    There is no immunization history on file for this patient.   The following portions of the patient's history were reviewed and updated as appropriate: allergies, current medications, past medical history, past social history, past surgical history and problem list.  Objective:  There were no vitals filed for this visit.  Physical Exam Nursing note reviewed. Chaperone present: Declined chaperone.  Constitutional:      Appearance: Normal appearance.  HENT:     Head: Normocephalic.     Salivary Glands: Right salivary gland is not diffusely enlarged or tender. Left salivary gland is not diffusely enlarged or tender.     Mouth/Throat:     Lips: Pink. No lesions.     Mouth: Mucous membranes are moist. No oral lesions.     Tongue: No lesions. Tongue does not deviate from midline.     Pharynx: Oropharynx is clear. Uvula midline. No oropharyngeal exudate or posterior oropharyngeal erythema.     Tonsils: No tonsillar exudate.  Eyes:     General:        Right eye: No discharge.        Left eye: No discharge.     Conjunctiva/sclera:     Right eye: Right  conjunctiva is not injected. No exudate.    Left eye: Left conjunctiva is not injected. No exudate. Pulmonary:     Effort: Pulmonary effort is normal.  Genitourinary:    Comments: Patient asymptomatic and declines genital exam.  Lymphadenopathy:     Head:     Right side of head: No submental, submandibular, tonsillar, preauricular or posterior auricular adenopathy.     Left side of head: No submental, submandibular, tonsillar, preauricular or posterior auricular adenopathy.     Cervical: No cervical adenopathy.     Right cervical: No superficial or posterior  cervical adenopathy.    Left cervical: No superficial or posterior cervical adenopathy.     Upper Body:     Right upper body: No supraclavicular or axillary adenopathy.     Left upper body: No supraclavicular or axillary adenopathy.  Skin:    General: Skin is warm and dry.     Findings: No lesion or rash.     Comments: Skin tone appropriate for ethnicity. Assessed exposed areas and back only.   Neurological:     Mental Status: He is alert and oriented to person, place, and time.  Psychiatric:        Attention and Perception: Attention and perception normal.        Mood and Affect: Mood and affect normal.        Speech: Speech normal.        Behavior: Behavior normal. Behavior is cooperative.        Thought Content: Thought content normal.     Assessment and Plan:  Jeffery Sutton is a 38 y.o. male presenting to the [facility] Department for STI screening  1. Screening for venereal disease (Primary) State labs not ordered as patient exposed provider to body fluids (saliva to provider face during throat swab) during PE. Instead [facility] tests ordered per approval by Herby Abraham, Director of Nursing.   - Gonococcus culture - Chlamydia/GC NAA, Confirmation - HIV Antibody (routine testing w rflx) - Hepatitis B surface antigen - HCV Ab w Reflex to Quant PCR - RPR  Patient does not have STI symptoms Patient accepted all screenings including  urine GC/Chlamydia, and blood work for HIV/Syphilis. Patient meets criteria for HepB screening? Yes. Ordered? yes Patient meets criteria for HepC screening? Yes. Ordered? yes Recommended condom use with all sex Discussed importance of condom use for STI prevention  Treat positive test results per standing order. Discussed time line for State Lab results and that patient will be called with positive results and encouraged patient to call if he had not heard in 2 Nephew Recommended repeat testing in 3 months with positive  results. Recommended returning for continued or worsening symptoms.   Return if symptoms worsen or fail to improve.  No future appointments.  Total time with patient 15 minutes.   Edmonia James, NP

## 2023-05-15 LAB — HCV AB W REFLEX TO QUANT PCR: HCV Ab: REACTIVE — AB

## 2023-05-15 LAB — HEPATITIS B SURFACE ANTIGEN: Hepatitis B Surface Ag: NEGATIVE

## 2023-05-15 LAB — HIV ANTIBODY (ROUTINE TESTING W REFLEX): HIV Screen 4th Generation wRfx: NONREACTIVE

## 2023-05-15 LAB — HCV RT-PCR, QUANT (NON-GRAPH)
HCV log10: 5.134 {Log_IU}/mL
Hepatitis C Quantitation: 136000 [IU]/mL

## 2023-05-15 LAB — RPR: RPR Ser Ql: NONREACTIVE

## 2023-05-18 LAB — CHLAMYDIA/GC NAA, CONFIRMATION
Chlamydia trachomatis, NAA: NEGATIVE
Neisseria gonorrhoeae, NAA: NEGATIVE

## 2023-05-18 LAB — GONOCOCCUS CULTURE

## 2023-05-21 ENCOUNTER — Telehealth: Payer: Self-pay

## 2023-05-21 NOTE — Telephone Encounter (Signed)
 Phone call to patient to advise of positive test results for Hep C drawn on 2/.28 Notified that HIV test results negative. Educated/counseled on Hep C. Discussed recommendations for Hep C treatment and where could seek treatment from provider. Pt Verbalizes understanding. States that in the past he had a family member who had tattoo gun and used on more than one person could be potential source of exposure.

## 2023-06-17 NOTE — Addendum Note (Signed)
 Addended by: Heywood Bene on: 06/17/2023 11:53 AM   Modules accepted: Orders

## 2023-09-18 ENCOUNTER — Emergency Department: Admission: EM | Admit: 2023-09-18 | Payer: Self-pay | Source: Home / Self Care

## 2023-11-04 ENCOUNTER — Encounter: Payer: Self-pay | Admitting: Emergency Medicine

## 2023-11-04 ENCOUNTER — Emergency Department
Admission: EM | Admit: 2023-11-04 | Discharge: 2023-11-04 | Disposition: A | Discharge status: Home / Self Care | Payer: Self-pay | Attending: Emergency Medicine | Admitting: Emergency Medicine

## 2023-11-04 ENCOUNTER — Other Ambulatory Visit: Payer: Self-pay

## 2023-11-04 ENCOUNTER — Emergency Department: Payer: Self-pay

## 2023-11-04 DIAGNOSIS — J020 Streptococcal pharyngitis: Secondary | ICD-10-CM | POA: Insufficient documentation

## 2023-11-04 LAB — CBC
HCT: 45.5 % (ref 39.0–52.0)
Hemoglobin: 14.9 g/dL (ref 13.0–17.0)
MCH: 27 pg (ref 26.0–34.0)
MCHC: 32.7 g/dL (ref 30.0–36.0)
MCV: 82.6 fL (ref 80.0–100.0)
Platelets: 412 K/uL — ABNORMAL HIGH (ref 150–400)
RBC: 5.51 MIL/uL (ref 4.22–5.81)
RDW: 13.1 % (ref 11.5–15.5)
WBC: 4.9 K/uL (ref 4.0–10.5)
nRBC: 0 % (ref 0.0–0.2)

## 2023-11-04 LAB — BASIC METABOLIC PANEL WITH GFR
Anion gap: 12 (ref 5–15)
BUN: 10 mg/dL (ref 6–20)
CO2: 26 mmol/L (ref 22–32)
Calcium: 9.6 mg/dL (ref 8.9–10.3)
Chloride: 100 mmol/L (ref 98–111)
Creatinine, Ser: 0.98 mg/dL (ref 0.61–1.24)
GFR, Estimated: >60 mL/min (ref >60)
Glucose, Bld: 90 mg/dL (ref 70–99)
Potassium: 3.9 mmol/L (ref 3.5–5.1)
Sodium: 138 mmol/L (ref 135–145)

## 2023-11-04 LAB — GROUP A STREP BY PCR: Group A Strep by PCR: DETECTED — AB

## 2023-11-04 MED ORDER — CEPHALEXIN 500 MG PO CAPS
500.0000 mg | ORAL_CAPSULE | Freq: Once | ORAL | Status: AC
  Administered 2023-11-04: 500 mg via ORAL
  Filled 2023-11-04: qty 1

## 2023-11-04 MED ORDER — KETOROLAC TROMETHAMINE 15 MG/ML IJ SOLN
15.0000 mg | Freq: Once | INTRAMUSCULAR | Status: AC
  Administered 2023-11-04: 15 mg via INTRAVENOUS
  Filled 2023-11-04: qty 1

## 2023-11-04 MED ORDER — DEXAMETHASONE SODIUM PHOSPHATE 10 MG/ML IJ SOLN
10.0000 mg | Freq: Once | INTRAMUSCULAR | Status: AC
  Administered 2023-11-04: 10 mg via INTRAVENOUS
  Filled 2023-11-04: qty 1

## 2023-11-04 MED ORDER — CEPHALEXIN 500 MG PO CAPS: 500.0000 mg | ORAL_CAPSULE | Freq: Two times a day (BID) | ORAL | 0 refills | Status: AC

## 2023-11-04 MED ORDER — IOHEXOL 300 MG/ML  SOLN
75.0000 mL | Freq: Once | INTRAMUSCULAR | Status: AC | PRN
  Administered 2023-11-04: 75 mL via INTRAVENOUS

## 2023-11-04 NOTE — ED Triage Notes (Signed)
 Pt reports left sided jaw swelling that started yesterday. PT also reports fevers at home. Denies and throat or tongue swelling.

## 2023-11-04 NOTE — ED Provider Notes (Signed)
 [facility] Provider Note    Event Date/Time   First MD Initiated Contact with Patient 11/04/23 1040     (approximate)   History   Facial Swelling   HPI  Jeffery Sutton is a 38 y.o. male presents to the emergency department with left-sided facial swelling.  States that a week ago he had an episode and since then his jaw has been hurting.  Does not state further what this episode was.  States that over the past week feeling like he has swelling to the left side of his face and neck.  Change in voice and feels like he has a muffled voice and it is difficult to open his mouth all the way.  Denies nausea or vomiting.  Some trouble with swallowing.  No recent antibiotic use.  No falls or trauma.     Physical Exam   Triage Vital Signs: ED Triage Vitals [11/04/23 1030]  Encounter Vitals Group     BP (!) 136/90     Girls Systolic BP Percentile      Girls Diastolic BP Percentile      Boys Systolic BP Percentile      Boys Diastolic BP Percentile      Pulse Rate 97     Resp 17     Temp 98.8 F (37.1 C)     Temp Source Oral     SpO2 100 %     Weight      Height      Head Circumference      Peak Flow      Pain Score      Pain Loc      Pain Education      Exclude from Growth Chart     Most recent vital signs: Vitals:   11/04/23 1352 11/04/23 1530  BP: 136/88 130/86  Pulse: 90 88  Resp: 17 17  Temp: 98.4 F (36.9 C) 98.1 F (36.7 C)  SpO2: 100% 98%    Physical Exam Constitutional:      Appearance: He is well-developed. He is not ill-appearing.     Comments: Muffled voice  HENT:     Head: Atraumatic.     Comments: Trismus with 3 finger breaths.  Uvula midline.  Tonsils are 2+ bilaterally with no significant exudate.  No obvious dental abscess or tooth tenderness to palpation. Eyes:     Extraocular Movements: Extraocular movements intact.     Conjunctiva/sclera: Conjunctivae normal.     Pupils: Pupils are equal, round, and reactive to  light.  Cardiovascular:     Rate and Rhythm: Regular rhythm.  Pulmonary:     Effort: No respiratory distress.  Abdominal:     Tenderness: There is no abdominal tenderness.  Musculoskeletal:        General: Normal range of motion.     Cervical back: Normal range of motion. Tenderness present.  Lymphadenopathy:     Cervical: Cervical adenopathy present.  Skin:    General: Skin is warm.     Capillary Refill: Capillary refill takes less than 2 seconds.  Neurological:     Mental Status: He is alert. Mental status is at baseline.      IMPRESSION / MDM / ASSESSMENT AND PLAN / ED COURSE  I reviewed the triage vital signs and the nursing notes.  Differential diagnosis including strep pharyngitis, viral pharyngitis, PTA, RPA, abscess   RADIOLOGY I independently reviewed imaging, my interpretation of imaging: CT scan face with contrast -no obvious  abscess.  Read as prominent palate teen tonsils with bilateral cervical lymphadenopathy -concern for tonsillitis/pharyngitis nonspecific and recommended f/u closely   Labs (all labs ordered are listed, but only abnormal results are displayed) Labs interpreted as -    Labs Reviewed  GROUP A STREP BY PCR - Abnormal; Notable for the following components:      Result Value   Group A Strep by PCR DETECTED (*)    All other components within normal limits  CBC - Abnormal; Notable for the following components:   Platelets 412 (*)    All other components within normal limits  BASIC METABOLIC PANEL WITH GFR     Clinical Course as of 11/04/23 1548  Mon Nov 04, 2023  1227 States he is feeling somewhat better.  Strep test came back positive.  No significant leukocytosis.  No significant electrolyte abnormality and creatinine is at baseline. [SM]    Clinical Course User Index [SM] Suzanne Kirsch, MD   No leukocytosis, cr at baseline, no electrolyte abnormalities.  Group A strep detected.  Patient given IV ketorolac , Decadron  and antibiotics in  the emergency department.  Given Keflex  given his penicillin allergy.  CT scan read as no findings of abscess.  Concern for tonsillitis or pharyngitis with reactive lymphadenopathy.  Patient does have a positive strep test with symptoms.  On reevaluation tolerating his secretions and tolerating p.o.  Given information to follow-up closely with ENT and primary care.  Discussed return if he had no improvement of symptoms or any worsening of symptoms.  PROCEDURES:  Critical Care performed: No  Procedures  Patient's presentation is most consistent with acute complicated illness / injury requiring diagnostic workup.   MEDICATIONS ORDERED IN ED: Medications  ketorolac  (TORADOL ) 15 MG/ML injection 15 mg (15 mg Intravenous Given 11/04/23 1100)  iohexol  (OMNIPAQUE ) 300 MG/ML solution 75 mL (75 mLs Intravenous Contrast Given 11/04/23 1307)  cephALEXin  (KEFLEX ) capsule 500 mg (500 mg Oral Given 11/04/23 1333)  dexamethasone  (DECADRON ) injection 10 mg (10 mg Intravenous Given 11/04/23 1333)    FINAL CLINICAL IMPRESSION(S) / ED DIAGNOSES   Final diagnoses:  Strep pharyngitis     Rx / DC Orders   ED Discharge Orders          Ordered    cephALEXin  (KEFLEX ) 500 MG capsule  2 times daily        11/04/23 1515             Note:  This document was prepared using Dragon voice recognition software and may include unintentional dictation errors.   Suzanne Kirsch, MD 11/04/23 [phone]

## 2023-11-04 NOTE — Discharge Instructions (Addendum)
 You were seen in the emergency department for sore throat.  You tested positive for strep throat.  You had a CT scan that showed inflammation but no obvious abscess.  You were given a prescription for an antibiotic.  Take as prescribed.  You are given information to follow-up with the ear nose and throat specialist.  It is importantly return to the emergency department if you have any worsening symptoms.  Stay hydrated and drink plenty of fluids.  If you have worsening sore throat, high fever or are unable to take your antibiotics it is important he return to the emergency department for reevaluation.  Pain control:  Ibuprofen  (motrin /aleve /advil ) - You can take 3 tablets (600 mg) every 6 hours as needed for pain/fever.  Acetaminophen  (tylenol ) - You can take 2 extra strength tablets (1000 mg) every 6 hours as needed for pain/fever.  You can alternate these medications or take them together.  Make sure you eat food/drink water when taking these medications.

## 2023-12-09 ENCOUNTER — Emergency Department
Admission: EM | Admit: 2023-12-09 | Discharge: 2023-12-09 | Disposition: A | Discharge status: Home / Self Care | Payer: Self-pay | Attending: Emergency Medicine | Admitting: Emergency Medicine

## 2023-12-09 ENCOUNTER — Emergency Department: Payer: Self-pay

## 2023-12-09 ENCOUNTER — Other Ambulatory Visit: Payer: Self-pay

## 2023-12-09 DIAGNOSIS — X501XXA Overexertion from prolonged static or awkward postures, initial encounter: Secondary | ICD-10-CM | POA: Insufficient documentation

## 2023-12-09 DIAGNOSIS — S39012A Strain of muscle, fascia and tendon of lower back, initial encounter: Secondary | ICD-10-CM | POA: Insufficient documentation

## 2023-12-09 MED ORDER — LIDOCAINE 5 % EX PTCH
1.0000 | MEDICATED_PATCH | CUTANEOUS | Status: DC
  Administered 2023-12-09: 1 via TRANSDERMAL
  Filled 2023-12-09 (×2): qty 1

## 2023-12-09 MED ORDER — CYCLOBENZAPRINE HCL 10 MG PO TABS
5.0000 mg | ORAL_TABLET | Freq: Once | ORAL | Status: AC
  Administered 2023-12-09: 5 mg via ORAL
  Filled 2023-12-09: qty 1

## 2023-12-09 MED ORDER — KETOROLAC TROMETHAMINE 30 MG/ML IJ SOLN
30.0000 mg | Freq: Once | INTRAMUSCULAR | Status: AC
  Administered 2023-12-09: 30 mg via INTRAMUSCULAR
  Filled 2023-12-09: qty 1

## 2023-12-09 MED ORDER — CYCLOBENZAPRINE HCL 5 MG PO TABS: 5.0000 mg | ORAL_TABLET | Freq: Three times a day (TID) | ORAL | 0 refills | Status: AC | PRN

## 2023-12-09 MED ORDER — DEXAMETHASONE SODIUM PHOSPHATE 10 MG/ML IJ SOLN
10.0000 mg | Freq: Once | INTRAMUSCULAR | Status: AC
  Administered 2023-12-09: 10 mg via INTRAMUSCULAR
  Filled 2023-12-09: qty 1

## 2023-12-09 NOTE — Discharge Instructions (Addendum)
 Your lumbar x-ray is normal.  Your presentation is consistent with a muscle sprain.  Please limit physical activity and follow-up with your primary care provider as needed.  If any new or worsening symptoms occur such as  Pain control:  Ibuprofen  (motrin /aleve /advil ) - You can take 3 tablets (600 mg) every 6 hours as needed for pain/fever.  Acetaminophen  (tylenol ) - You can take 2 extra strength tablets (1000 mg) every 6 hours as needed for pain/fever.  You can alternate these medications or take them together.  Make sure you eat food/drink water when taking these medications.

## 2023-12-09 NOTE — ED Triage Notes (Signed)
 Pt comes with c/o lower back pain that started on Saturday. Pt states it now hurts to even move.

## 2023-12-09 NOTE — ED Provider Notes (Signed)
 [facility] Emergency Department Provider Note     Event Date/Time   First MD Initiated Contact with Patient 12/09/23 1143     (approximate)   History   Back Pain   HPI  Jeffery Sutton is a 38 y.o. male with a past medical history of substance abuse presents to the ED for evaluation of lower back pain x 2 days with radiation described as shooting pain down left leg.  Patient reports he was helping his friend move when he felt a back spasm and has since progressively worsened.  He has tried ibuprofen  with no relief.  Denies loss of bladder or bowel control.  Denies saddle anesthesia.  No other complaint.     Physical Exam   Triage Vital Signs: ED Triage Vitals  Encounter Vitals Group     BP 12/09/23 1119 119/81     Girls Systolic BP Percentile --      Girls Diastolic BP Percentile --      Boys Systolic BP Percentile --      Boys Diastolic BP Percentile --      Pulse Rate 12/09/23 1119 89     Resp 12/09/23 1119 18     Temp 12/09/23 1119 99.2 F (37.3 C)     Temp src --      SpO2 12/09/23 1119 100 %     Weight --      Height --      Head Circumference --      Peak Flow --      Pain Score 12/09/23 1118 8     Pain Loc --      Pain Education --      Exclude from Growth Chart --     Most recent vital signs: Vitals:   12/09/23 1119  BP: 119/81  Pulse: 89  Resp: 18  Temp: 99.2 F (37.3 C)  SpO2: 100%    General: Well appearing. Alert and oriented. INAD.  Head:  NCAT.  Neck:   No cervical spine tenderness to palpation. Full ROM without difficulty.  CV:  Good peripheral perfusion.  RESP:  Normal effort.  BACK:  Spinous process is midline without deformity or tenderness. (+) left SLRT NEURO: Cranial nerves intact. No focal deficits. Sensation and motor function intact. Normal muscle strength of UE & LE. Gait is steady.   ED Results / Procedures / Treatments   Labs (all labs ordered are listed, but only abnormal results are  displayed) Labs Reviewed - No data to display  RADIOLOGY  I personally viewed and evaluated these images as part of my medical decision making, as well as reviewing the written report by the radiologist.  ED Provider Interpretation: Normal lumbar x-ray  DG Lumbar Spine 2-3 Views Result Date: 12/09/2023 CLINICAL DATA:  Low back pain EXAM: LUMBAR SPINE - 2-3 VIEW COMPARISON:  CT abdomen pelvis 07/28/2020 FINDINGS: Five lumbar type vertebral bodies are seen. Alignment is within normal limits. No significant disc or vertebral body height loss. IMPRESSION: Negative. Electronically Signed   By: Aliene Lloyd M.D.   On: 12/09/2023 11:57    PROCEDURES:  Critical Care performed: No  Procedures   MEDICATIONS ORDERED IN ED: Medications  ketorolac  (TORADOL ) 30 MG/ML injection 30 mg (30 mg Intramuscular Given 12/09/23 1237)  dexamethasone  (DECADRON ) injection 10 mg (10 mg Intramuscular Given 12/09/23 1238)  cyclobenzaprine  (FLEXERIL ) tablet 5 mg (5 mg Oral Given 12/09/23 1243)     IMPRESSION / MDM / ASSESSMENT AND PLAN /  ED COURSE  I reviewed the triage vital signs and the nursing notes.                              Clinical Course as of 12/09/23 1812  Mon Dec 09, 2023  1211 DG Lumbar Spine 2-3 Views IMPRESSION: Negative.   [MH]    Clinical Course User Index [MH] Margrette Monte A, PA-C    38 y.o. male presents to the emergency department for evaluation and treatment of low back pain. See HPI for further details.   Differential diagnosis includes, but is not limited to muscle spasm, strain, radiculopathy  Patient's presentation is most consistent with acute complicated illness / injury requiring diagnostic workup.  Patient is alert and oriented.  He is hemodynamic stable.  Physical exam findings are stated above and pertinent for positive straight leg raise test to the left leg.  Lumbar x-ray is reassuring.  Low suspicion for compression injury.  Will treat with Toradol  and muscle  relaxer.  Work note provided.  Patient stable condition for discharge home.  ED return precaution discussed.   FINAL CLINICAL IMPRESSION(S) / ED DIAGNOSES   Final diagnoses:  Strain of lumbar region, initial encounter   Rx / DC Orders   ED Discharge Orders          Ordered    cyclobenzaprine  (FLEXERIL ) 5 MG tablet  3 times daily PRN        12/09/23 1217           Note:  This document was prepared using Dragon voice recognition software and may include unintentional dictation errors.    Margrette, Raj Landress A, PA-C 12/09/23 ARLETHA    Suzanne Kirsch, MD 12/10/23 [phone]

## 2024-03-18 ENCOUNTER — Emergency Department: Payer: Self-pay

## 2024-03-18 ENCOUNTER — Other Ambulatory Visit: Payer: Self-pay

## 2024-03-18 ENCOUNTER — Emergency Department
Admission: EM | Admit: 2024-03-18 | Discharge: 2024-03-18 | Disposition: A | Discharge status: Home / Self Care | Payer: Self-pay | Attending: Emergency Medicine | Admitting: Emergency Medicine

## 2024-03-18 DIAGNOSIS — J069 Acute upper respiratory infection, unspecified: Secondary | ICD-10-CM | POA: Insufficient documentation

## 2024-03-18 MED ORDER — IPRATROPIUM-ALBUTEROL 0.5-2.5 (3) MG/3ML IN SOLN
3.0000 mL | Freq: Once | RESPIRATORY_TRACT | Status: AC
  Administered 2024-03-18: 3 mL via RESPIRATORY_TRACT
  Filled 2024-03-18: qty 3

## 2024-03-18 MED ORDER — GUAIFENESIN ER 600 MG PO TB12: 600.0000 mg | ORAL_TABLET | Freq: Two times a day (BID) | ORAL | 0 refills | Status: AC

## 2024-03-18 MED ORDER — ALBUTEROL SULFATE HFA 108 (90 BASE) MCG/ACT IN AERS: 2.0000 | INHALATION_SPRAY | Freq: Four times a day (QID) | RESPIRATORY_TRACT | 0 refills | Status: AC | PRN

## 2024-03-18 NOTE — Discharge Instructions (Addendum)
 You were diagnosed with an influenza-like viral illness.  You will feel ill for as much as a few [place].  Please take any prescribed medications as instructed, and you may use over-the-counter Tylenol  and/or ibuprofen  as needed according to label instructions (unless you have an allergy to either or have been told by your doctor not to take them).  Please make sure to drink plenty of fluids and refer to the included information about rehydration.  Follow up with your physician as instructed above, and return to the Emergency Department (ED) if you are unable to tolerate fluids due to vomiting, have worsening trouble breathing, chest pain, become extremely tired or difficult to awaken, or if you develop any other symptoms that concern you.

## 2024-03-18 NOTE — ED Triage Notes (Signed)
 Pt reports for the past 3 days he has had cough congestion body aches

## 2024-03-18 NOTE — ED Provider Notes (Cosign Needed Addendum)
 "  [facility] Provider Note    Event Date/Time   First MD Initiated Contact with Patient 03/18/24 2107     (approximate)   History   Cough   HPI  Jeffery Sutton is a 39 y.o. male  with a past medical history of HCV antibody positive, seizures presents to the emergency department with cough, nasal congestion, chills and SOB for 3 days.  Reports the breathing is worsened with exertion and was not present prior to 3 days ago.  Reports he was recently in contact with his brother who had similar symptoms.  Denies chest pain, sore throat, otalgia, abdominal pain, vomiting.  Has not checked for fever at home. No hx of asthma.  Physical Exam   Triage Vital Signs: ED Triage Vitals  Encounter Vitals Group     BP 03/18/24 2019 120/84     Girls Systolic BP Percentile --      Girls Diastolic BP Percentile --      Boys Systolic BP Percentile --      Boys Diastolic BP Percentile --      Pulse Rate 03/18/24 2019 82     Resp 03/18/24 2019 (!) 22     Temp 03/18/24 2019 97.8 F (36.6 C)     Temp Source 03/18/24 2019 Oral     SpO2 03/18/24 2019 100 %     Weight 03/18/24 2021 170 lb (77.1 kg)     Height 03/18/24 2021 6' (1.829 m)     Head Circumference --      Peak Flow --      Pain Score 03/18/24 2020 0     Pain Loc --      Pain Education --      Exclude from Growth Chart --    OT Most recent vital signs: Vitals:   03/18/24 2019 03/18/24 2242  BP: 120/84   Pulse: 82   Resp: (!) 22 17  Temp: 97.8 F (36.6 C)   SpO2: 100%     General: Awake, able to speak in full sentences. Head: Normocephalic, atraumatic. Ears/Nose/Throat: Nares patent, no nasal discharge. Oropharynx moist, no erythema or exudate. Dentition intact. CV: Good peripheral perfusion. RRR 82 bpm. Respiratory: Increased respiratory effort, 22 bpm. CTAB. No wheezes, rales or rhonchi. GI: Soft, non-distended, non-tender.  Skin:Warm, dry, intact. No rashes, lesions, or ecchymosis. No cyanosis or  pallor. Neurological: A&Ox4 to person, place, time, and situation.   ED Results / Procedures / Treatments   Labs (all labs ordered are listed, but only abnormal results are displayed) Labs Reviewed - No data to display   EKG     RADIOLOGY CXR FINDINGS:   LUNGS AND PLEURA: No focal pulmonary opacity. No pleural effusion. No pneumothorax.   HEART AND MEDIASTINUM: No acute abnormality of the cardiac and mediastinal silhouettes.   BONES AND SOFT TISSUES: No acute osseous abnormality.   IMPRESSION: 1. No active cardiopulmonary disease.   PROCEDURES:  Critical Care performed: No   Procedures   MEDICATIONS ORDERED IN ED: Medications  ipratropium-albuterol  (DUONEB) 0.5-2.5 (3) MG/3ML nebulizer solution 3 mL (3 mLs Nebulization Given 03/18/24 2156)     IMPRESSION / MDM / ASSESSMENT AND PLAN / ED COURSE  I reviewed the triage vital signs and the nursing notes.                              Differential diagnosis includes, but is not limited to, upper  respiratory illness, pneumothorax, pneumonia, asthma exacerbation  Patient's presentation is most consistent with acute complicated illness / injury requiring diagnostic workup.  Patient is a 39 year old male presenting with signs and symptoms as described above.  He is mildly tachypneic on my evaluation.  He is not diaphoretic. He does not have any chest pain.  His symptoms are consistent with an upper respiratory infection and he has been around his family member with similar symptoms.  I discussed with him that we are not ordering COVID, flu and RSV testing at this time given her hospital is in low supply and they are only for patients with possible admission.  Patient understands.  Chest x-ray was ordered in triage. I independently viewed the x-ray and radiologist's report.  I agree with the radiologist's report that there are no acute findings.  He has no abnormal lung sounds and is at O2 sat 100% on room air.  He was  provided 1 DuoNeb and reports feeling better afterwards.  Respiratory rate was reduced to 16 RPM on my reevaluation after the DuoNeb.  Patient would like to go home at this time.  I will send him home with an albuterol  inhaler and Mucinex  to use as needed.  Discussed symptomatic treatment with him and following up with his PCP as needed.  The patient may return to the emergency department for any worsening shortness of breath, or any new, worsening, or concerning symptoms. Patient was given the opportunity to ask questions; all questions were answered. Emergency department return precautions were discussed with the patient.  Patient is in agreement to the treatment plan.  Patient is stable for discharge.   FINAL CLINICAL IMPRESSION(S) / ED DIAGNOSES   Final diagnoses:  Acute upper respiratory infection     Rx / DC Orders   ED Discharge Orders          Ordered    albuterol  (VENTOLIN  HFA) 108 (90 Base) MCG/ACT inhaler  Every 6 hours PRN        03/18/24 2240    guaiFENesin  (MUCINEX ) 600 MG 12 hr tablet  2 times daily        03/18/24 2240             Note:  This document was prepared using Dragon voice recognition software and may include unintentional dictation errors.     Sheron Salm, PA-C 03/19/24 0035    Sheron Salm, PA-C 03/19/24 [phone]  "
